# Patient Record
Sex: Female | Born: 1980 | Race: Black or African American | Hispanic: No | Marital: Married | Smoking: Never smoker
Health system: Southern US, Community
[De-identification: ages and names within clinical notes are randomized; demographics above are authoritative.]

## PROBLEM LIST (undated history)

## (undated) DIAGNOSIS — D649 Anemia, unspecified: Secondary | ICD-10-CM

## (undated) DIAGNOSIS — G43909 Migraine, unspecified, not intractable, without status migrainosus: Secondary | ICD-10-CM

## (undated) DIAGNOSIS — G479 Sleep disorder, unspecified: Secondary | ICD-10-CM

## (undated) DIAGNOSIS — G4710 Hypersomnia, unspecified: Secondary | ICD-10-CM

## (undated) DIAGNOSIS — J45909 Unspecified asthma, uncomplicated: Secondary | ICD-10-CM

## (undated) DIAGNOSIS — D489 Neoplasm of uncertain behavior, unspecified: Secondary | ICD-10-CM

## (undated) HISTORY — DX: Neoplasm of uncertain behavior, unspecified: D48.9

## (undated) HISTORY — PX: REFRACTIVE SURGERY: SHX103

## (undated) HISTORY — DX: Hypersomnia, unspecified: G47.10

## (undated) HISTORY — DX: Migraine, unspecified, not intractable, without status migrainosus: G43.909

## (undated) HISTORY — PX: NASAL SEPTUM SURGERY: SHX37

## (undated) HISTORY — PX: TONSILLECTOMY: SUR1361

## (undated) HISTORY — PX: ROTATOR CUFF REPAIR: SHX139

---

## 2013-01-15 DIAGNOSIS — M2550 Pain in unspecified joint: Secondary | ICD-10-CM | POA: Insufficient documentation

## 2013-01-15 DIAGNOSIS — R197 Diarrhea, unspecified: Secondary | ICD-10-CM | POA: Insufficient documentation

## 2013-01-15 DIAGNOSIS — R109 Unspecified abdominal pain: Secondary | ICD-10-CM | POA: Insufficient documentation

## 2013-01-15 DIAGNOSIS — R1013 Epigastric pain: Secondary | ICD-10-CM | POA: Insufficient documentation

## 2013-12-27 DIAGNOSIS — D509 Iron deficiency anemia, unspecified: Secondary | ICD-10-CM | POA: Insufficient documentation

## 2019-01-21 ENCOUNTER — Other Ambulatory Visit: Payer: Self-pay

## 2019-01-21 ENCOUNTER — Emergency Department (HOSPITAL_COMMUNITY)
Admission: EM | Admit: 2019-01-21 | Discharge: 2019-01-21 | Disposition: A | Discharge status: Home / Self Care | Payer: No Typology Code available for payment source | Attending: Emergency Medicine | Admitting: Emergency Medicine

## 2019-01-21 ENCOUNTER — Emergency Department (HOSPITAL_COMMUNITY): Payer: No Typology Code available for payment source

## 2019-01-21 ENCOUNTER — Encounter (HOSPITAL_COMMUNITY): Payer: Self-pay

## 2019-01-21 DIAGNOSIS — M545 Low back pain, unspecified: Secondary | ICD-10-CM

## 2019-01-21 DIAGNOSIS — Y92410 Unspecified street and highway as the place of occurrence of the external cause: Secondary | ICD-10-CM | POA: Diagnosis not present

## 2019-01-21 DIAGNOSIS — M25512 Pain in left shoulder: Secondary | ICD-10-CM

## 2019-01-21 DIAGNOSIS — M25522 Pain in left elbow: Secondary | ICD-10-CM | POA: Insufficient documentation

## 2019-01-21 DIAGNOSIS — Y93I9 Activity, other involving external motion: Secondary | ICD-10-CM | POA: Diagnosis not present

## 2019-01-21 DIAGNOSIS — M79642 Pain in left hand: Secondary | ICD-10-CM | POA: Diagnosis not present

## 2019-01-21 DIAGNOSIS — Y999 Unspecified external cause status: Secondary | ICD-10-CM | POA: Diagnosis not present

## 2019-01-21 MED ORDER — METHOCARBAMOL 500 MG PO TABS: 500.0000 mg | ORAL_TABLET | Freq: Two times a day (BID) | ORAL | 0 refills | Status: DC

## 2019-01-21 MED ORDER — NAPROXEN 500 MG PO TABS: 500.0000 mg | ORAL_TABLET | Freq: Two times a day (BID) | ORAL | 0 refills | Status: DC

## 2019-01-21 NOTE — ED Notes (Signed)
Pt refused to do a urine pregnancy test because she stated that she is on her period, she has the IUD & has not had sex in over a year (per pt) & that she will sign a consent if necessary to skip that part since radiology is waiting on the results of that test to get her scans done. PA was notified by the NT of this zone in the ED.
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
## 2019-01-21 NOTE — ED Notes (Signed)
Patient verbalizes understanding of discharge instructions. Opportunity for questioning and answers were provided. Armband removed by staff, pt discharged from ED.  

## 2019-01-21 NOTE — ED Notes (Signed)
Patient transported to X-ray 
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
## 2019-05-14 ENCOUNTER — Other Ambulatory Visit: Payer: Self-pay

## 2019-05-14 ENCOUNTER — Encounter (HOSPITAL_BASED_OUTPATIENT_CLINIC_OR_DEPARTMENT_OTHER): Payer: Self-pay | Admitting: Orthopedic Surgery

## 2019-05-14 ENCOUNTER — Other Ambulatory Visit (HOSPITAL_COMMUNITY)
Admission: RE | Admit: 2019-05-14 | Discharge: 2019-05-14 | Disposition: A | Discharge status: Home / Self Care | Payer: 59 | Source: Ambulatory Visit | Attending: Orthopedic Surgery | Admitting: Orthopedic Surgery

## 2019-05-14 DIAGNOSIS — Z20822 Contact with and (suspected) exposure to covid-19: Secondary | ICD-10-CM | POA: Insufficient documentation

## 2019-05-14 DIAGNOSIS — Z01812 Encounter for preprocedural laboratory examination: Secondary | ICD-10-CM | POA: Diagnosis present

## 2019-05-14 LAB — SARS CORONAVIRUS 2 (TAT 6-24 HRS): SARS Coronavirus 2: NEGATIVE
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
## 2019-05-16 ENCOUNTER — Encounter (HOSPITAL_BASED_OUTPATIENT_CLINIC_OR_DEPARTMENT_OTHER)
Admission: RE | Disposition: A | Discharge status: Home / Self Care | Payer: Self-pay | Source: Home / Self Care | Attending: Orthopedic Surgery

## 2019-05-16 ENCOUNTER — Ambulatory Visit (HOSPITAL_BASED_OUTPATIENT_CLINIC_OR_DEPARTMENT_OTHER): Payer: 59 | Admitting: Certified Registered"

## 2019-05-16 ENCOUNTER — Other Ambulatory Visit: Payer: Self-pay

## 2019-05-16 ENCOUNTER — Ambulatory Visit (HOSPITAL_BASED_OUTPATIENT_CLINIC_OR_DEPARTMENT_OTHER)
Admission: RE | Admit: 2019-05-16 | Discharge: 2019-05-16 | Disposition: A | Discharge status: Home / Self Care | Payer: 59 | Attending: Orthopedic Surgery | Admitting: Orthopedic Surgery

## 2019-05-16 ENCOUNTER — Encounter (HOSPITAL_BASED_OUTPATIENT_CLINIC_OR_DEPARTMENT_OTHER): Payer: Self-pay | Admitting: Orthopedic Surgery

## 2019-05-16 DIAGNOSIS — M25831 Other specified joint disorders, right wrist: Secondary | ICD-10-CM | POA: Insufficient documentation

## 2019-05-16 DIAGNOSIS — M25531 Pain in right wrist: Secondary | ICD-10-CM | POA: Diagnosis present

## 2019-05-16 DIAGNOSIS — E669 Obesity, unspecified: Secondary | ICD-10-CM | POA: Insufficient documentation

## 2019-05-16 DIAGNOSIS — Z6837 Body mass index (BMI) 37.0-37.9, adult: Secondary | ICD-10-CM | POA: Diagnosis not present

## 2019-05-16 DIAGNOSIS — D481 Neoplasm of uncertain behavior of connective and other soft tissue: Secondary | ICD-10-CM | POA: Insufficient documentation

## 2019-05-16 HISTORY — DX: Unspecified asthma, uncomplicated: J45.909

## 2019-05-16 HISTORY — DX: Sleep disorder, unspecified: G47.9

## 2019-05-16 HISTORY — DX: Anemia, unspecified: D64.9

## 2019-05-16 HISTORY — PX: MASS EXCISION: SHX2000

## 2019-05-16 LAB — POCT PREGNANCY, URINE: Preg Test, Ur: NEGATIVE

## 2019-05-16 SURGERY — EXCISION MASS
Anesthesia: General | Site: Wrist | Laterality: Right

## 2019-05-16 MED ORDER — LIDOCAINE 2% (20 MG/ML) 5 ML SYRINGE
INTRAMUSCULAR | Status: AC
  Filled 2019-05-16: qty 5

## 2019-05-16 MED ORDER — 0.9 % SODIUM CHLORIDE (POUR BTL) OPTIME
TOPICAL | Status: DC | PRN
  Administered 2019-05-16: 300 mL

## 2019-05-16 MED ORDER — PROPOFOL 10 MG/ML IV BOLUS
INTRAVENOUS | Status: AC
  Filled 2019-05-16: qty 20

## 2019-05-16 MED ORDER — ONDANSETRON HCL 4 MG/2ML IJ SOLN
INTRAMUSCULAR | Status: AC
  Filled 2019-05-16: qty 2

## 2019-05-16 MED ORDER — FENTANYL CITRATE (PF) 100 MCG/2ML IJ SOLN
INTRAMUSCULAR | Status: AC
  Filled 2019-05-16: qty 2

## 2019-05-16 MED ORDER — HYDROCODONE-ACETAMINOPHEN 5-325 MG PO TABS
ORAL_TABLET | ORAL | Status: AC
  Filled 2019-05-16: qty 1

## 2019-05-16 MED ORDER — FENTANYL CITRATE (PF) 100 MCG/2ML IJ SOLN: 50.0000 ug | INTRAMUSCULAR | Status: DC | PRN

## 2019-05-16 MED ORDER — OXYCODONE HCL 5 MG PO TABS: 5.0000 mg | ORAL_TABLET | Freq: Once | ORAL | Status: DC | PRN

## 2019-05-16 MED ORDER — BUPIVACAINE HCL (PF) 0.25 % IJ SOLN
INTRAMUSCULAR | Status: AC
  Filled 2019-05-16: qty 30

## 2019-05-16 MED ORDER — KETOROLAC TROMETHAMINE 30 MG/ML IJ SOLN: 30.0000 mg | Freq: Once | INTRAMUSCULAR | Status: DC | PRN

## 2019-05-16 MED ORDER — FENTANYL CITRATE (PF) 100 MCG/2ML IJ SOLN
25.0000 ug | INTRAMUSCULAR | Status: DC | PRN
  Administered 2019-05-16: 25 ug via INTRAVENOUS

## 2019-05-16 MED ORDER — BUPIVACAINE HCL (PF) 0.25 % IJ SOLN
INTRAMUSCULAR | Status: DC | PRN
  Administered 2019-05-16: 9 mL

## 2019-05-16 MED ORDER — HYDROCODONE-ACETAMINOPHEN 5-325 MG PO TABS
1.0000 | ORAL_TABLET | Freq: Once | ORAL | Status: AC
  Administered 2019-05-16: 1 via ORAL

## 2019-05-16 MED ORDER — OXYCODONE HCL 5 MG/5ML PO SOLN: 5.0000 mg | Freq: Once | ORAL | Status: DC | PRN

## 2019-05-16 MED ORDER — MIDAZOLAM HCL 5 MG/5ML IJ SOLN
INTRAMUSCULAR | Status: DC | PRN
  Administered 2019-05-16: 2 mg via INTRAVENOUS

## 2019-05-16 MED ORDER — MIDAZOLAM HCL 2 MG/2ML IJ SOLN
INTRAMUSCULAR | Status: AC
  Filled 2019-05-16: qty 2

## 2019-05-16 MED ORDER — DEXAMETHASONE SODIUM PHOSPHATE 10 MG/ML IJ SOLN
INTRAMUSCULAR | Status: AC
  Filled 2019-05-16: qty 2

## 2019-05-16 MED ORDER — FENTANYL CITRATE (PF) 250 MCG/5ML IJ SOLN
INTRAMUSCULAR | Status: DC | PRN
  Administered 2019-05-16 (×4): 25 ug via INTRAVENOUS

## 2019-05-16 MED ORDER — DEXAMETHASONE SODIUM PHOSPHATE 10 MG/ML IJ SOLN
INTRAMUSCULAR | Status: DC | PRN
  Administered 2019-05-16: 4 mg via INTRAVENOUS

## 2019-05-16 MED ORDER — LIDOCAINE 2% (20 MG/ML) 5 ML SYRINGE
INTRAMUSCULAR | Status: DC | PRN
  Administered 2019-05-16: 100 mg via INTRAVENOUS

## 2019-05-16 MED ORDER — PROMETHAZINE HCL 25 MG/ML IJ SOLN: 6.2500 mg | INTRAMUSCULAR | Status: DC | PRN

## 2019-05-16 MED ORDER — MIDAZOLAM HCL 2 MG/2ML IJ SOLN: 1.0000 mg | INTRAMUSCULAR | Status: DC | PRN

## 2019-05-16 MED ORDER — LACTATED RINGERS IV SOLN: INTRAVENOUS | Status: DC

## 2019-05-16 MED ORDER — ONDANSETRON HCL 4 MG/2ML IJ SOLN
INTRAMUSCULAR | Status: DC | PRN
  Administered 2019-05-16: 4 mg via INTRAVENOUS

## 2019-05-16 MED ORDER — CEFAZOLIN SODIUM-DEXTROSE 2-4 GM/100ML-% IV SOLN
2.0000 g | INTRAVENOUS | Status: AC
  Administered 2019-05-16: 13:00:00 2 g via INTRAVENOUS

## 2019-05-16 MED ORDER — PROPOFOL 10 MG/ML IV BOLUS
INTRAVENOUS | Status: DC | PRN
  Administered 2019-05-16: 200 mg via INTRAVENOUS

## 2019-05-16 MED ORDER — CEFAZOLIN SODIUM-DEXTROSE 2-4 GM/100ML-% IV SOLN
INTRAVENOUS | Status: AC
  Filled 2019-05-16: qty 100

## 2019-05-16 MED ORDER — CHLORHEXIDINE GLUCONATE 4 % EX LIQD: 60.0000 mL | Freq: Once | CUTANEOUS | Status: DC

## 2019-05-16 SURGICAL SUPPLY — 48 items
BENZOIN TINCTURE PRP APPL 2/3 (GAUZE/BANDAGES/DRESSINGS) IMPLANT
BLADE SURG 15 STRL LF DISP TIS (BLADE) ×1 IMPLANT
BLADE SURG 15 STRL SS (BLADE) ×1
BNDG CONFORM 3 STRL LF (GAUZE/BANDAGES/DRESSINGS) ×2 IMPLANT
BNDG ELASTIC 3X5.8 VLCR STR LF (GAUZE/BANDAGES/DRESSINGS) ×2 IMPLANT
BNDG ELASTIC 4X5.8 VLCR STR LF (GAUZE/BANDAGES/DRESSINGS) IMPLANT
BNDG ESMARK 4X9 LF (GAUZE/BANDAGES/DRESSINGS) ×2 IMPLANT
BNDG GAUZE 1X2.1 STRL (MISCELLANEOUS) IMPLANT
CORD BIPOLAR FORCEPS 12FT (ELECTRODE) ×2 IMPLANT
COVER BACK TABLE 60X90IN (DRAPES) ×2 IMPLANT
COVER MAYO STAND STRL (DRAPES) ×2 IMPLANT
COVER WAND RF STERILE (DRAPES) IMPLANT
CUFF TOURN SGL QUICK 18X4 (TOURNIQUET CUFF) ×2 IMPLANT
DECANTER SPIKE VIAL GLASS SM (MISCELLANEOUS) IMPLANT
DRAPE EXTREMITY T 121X128X90 (DISPOSABLE) ×2 IMPLANT
DRAPE SURG 17X23 STRL (DRAPES) ×2 IMPLANT
DRSG EMULSION OIL 3X3 NADH (GAUZE/BANDAGES/DRESSINGS) ×2 IMPLANT
GAUZE SPONGE 4X4 12PLY STRL (GAUZE/BANDAGES/DRESSINGS) ×2 IMPLANT
GLOVE BIOGEL PI IND STRL 8.5 (GLOVE) ×1 IMPLANT
GLOVE BIOGEL PI INDICATOR 8.5 (GLOVE) ×1
GLOVE SURG ORTHO 8.0 STRL STRW (GLOVE) ×2 IMPLANT
GLOVE SURG SS PI 7.0 STRL IVOR (GLOVE) ×2 IMPLANT
GOWN STRL REUS W/ TWL LRG LVL3 (GOWN DISPOSABLE) ×2 IMPLANT
GOWN STRL REUS W/ TWL XL LVL3 (GOWN DISPOSABLE) ×1 IMPLANT
GOWN STRL REUS W/TWL LRG LVL3 (GOWN DISPOSABLE) ×2
GOWN STRL REUS W/TWL XL LVL3 (GOWN DISPOSABLE) ×1
NEEDLE HYPO 25X1 1.5 SAFETY (NEEDLE) ×2 IMPLANT
NS IRRIG 1000ML POUR BTL (IV SOLUTION) ×2 IMPLANT
PACK BASIN DAY SURGERY FS (CUSTOM PROCEDURE TRAY) ×2 IMPLANT
PAD CAST 3X4 CTTN HI CHSV (CAST SUPPLIES) ×1 IMPLANT
PADDING CAST ABS 3INX4YD NS (CAST SUPPLIES) ×1
PADDING CAST ABS 4INX4YD NS (CAST SUPPLIES)
PADDING CAST ABS COTTON 3X4 (CAST SUPPLIES) ×1 IMPLANT
PADDING CAST ABS COTTON 4X4 ST (CAST SUPPLIES) IMPLANT
PADDING CAST COTTON 3X4 STRL (CAST SUPPLIES) ×1
SPLINT FIBERGLASS 3X35 (CAST SUPPLIES) ×2 IMPLANT
SPLINT PLASTER CAST XFAST 3X15 (CAST SUPPLIES) IMPLANT
SPLINT PLASTER XTRA FASTSET 3X (CAST SUPPLIES)
STOCKINETTE 4X48 STRL (DRAPES) ×2 IMPLANT
STRIP CLOSURE SKIN 1/2X4 (GAUZE/BANDAGES/DRESSINGS) IMPLANT
SUT MNCRL AB 3-0 PS2 18 (SUTURE) ×2 IMPLANT
SUT MNCRL AB 4-0 PS2 18 (SUTURE) ×2 IMPLANT
SUT PROLENE 4 0 PS 2 18 (SUTURE) ×2 IMPLANT
SYR BULB 3OZ (MISCELLANEOUS) ×2 IMPLANT
SYR CONTROL 10ML LL (SYRINGE) ×2 IMPLANT
TOWEL GREEN STERILE FF (TOWEL DISPOSABLE) ×2 IMPLANT
TRAY DSU PREP LF (CUSTOM PROCEDURE TRAY) ×2 IMPLANT
UNDERPAD 30X36 HEAVY ABSORB (UNDERPADS AND DIAPERS) ×2 IMPLANT

## 2019-05-16 NOTE — Anesthesia Preprocedure Evaluation (Signed)
Anesthesia Evaluation  Patient identified by MRN, date of birth, ID band Patient awake    Reviewed: Allergy & Precautions, NPO status , Patient's Chart, lab work & pertinent test results  Airway Mallampati: II  TM Distance: >3 FB Neck ROM: Full    Dental no notable dental hx.    Pulmonary neg pulmonary ROS,    Pulmonary exam normal breath sounds clear to auscultation       Cardiovascular negative cardio ROS Normal cardiovascular exam Rhythm:Regular Rate:Normal     Neuro/Psych negative neurological ROS  negative psych ROS   GI/Hepatic negative GI ROS, Neg liver ROS,   Endo/Other  obesity  Renal/GU negative Renal ROS  negative genitourinary   Musculoskeletal negative musculoskeletal ROS (+)   Abdominal   Peds negative pediatric ROS (+)  Hematology negative hematology ROS (+)   Anesthesia Other Findings   Reproductive/Obstetrics negative OB ROS                             Anesthesia Physical Anesthesia Plan  ASA: II  Anesthesia Plan: General   Post-op Pain Management:    Induction: Intravenous  PONV Risk Score and Plan: 3 and Ondansetron, Dexamethasone and Treatment may vary due to age or medical condition  Airway Management Planned: LMA  Additional Equipment:   Intra-op Plan:   Post-operative Plan: Extubation in OR  Informed Consent: I have reviewed the patients History and Physical, chart, labs and discussed the procedure including the risks, benefits and alternatives for the proposed anesthesia with the patient or authorized representative who has indicated his/her understanding and acceptance.     Dental advisory given  Plan Discussed with: CRNA and Surgeon  Anesthesia Plan Comments:         Anesthesia Quick Evaluation

## 2019-05-16 NOTE — Anesthesia Procedure Notes (Signed)
Procedure Name: LMA Insertion Date/Time: 05/16/2019 1:22 PM Performed by: Myna Bright, CRNA Pre-anesthesia Checklist: Patient identified, Emergency Drugs available, Suction available and Patient being monitored Patient Re-evaluated:Patient Re-evaluated prior to induction Oxygen Delivery Method: Circle system utilized Preoxygenation: Pre-oxygenation with 100% oxygen Induction Type: IV induction Ventilation: Mask ventilation without difficulty LMA: LMA inserted LMA Size: 4.0 Number of attempts: 1 Placement Confirmation: positive ETCO2 and breath sounds checked- equal and bilateral Tube secured with: Tape Dental Injury: Teeth and Oropharynx as per pre-operative assessment

## 2019-05-16 NOTE — Transfer of Care (Signed)
Immediate Anesthesia Transfer of Care Note  Patient: Kaylee Cowan  Procedure(s) Performed: Excision right wrist volar mass (Right Wrist)  Patient Location: PACU  Anesthesia Type:General  Level of Consciousness: awake, alert , oriented and patient cooperative  Airway & Oxygen Therapy: Patient Spontanous Breathing and Patient connected to face mask oxygen  Post-op Assessment: Report given to RN, Post -op Vital signs reviewed and stable and Patient moving all extremities  Post vital signs: Reviewed and stable  Last Vitals:  Vitals Value Taken Time  BP 124/83 05/16/19 1400  Temp    Pulse 109 05/16/19 1403  Resp 19 05/16/19 1403  SpO2 100 % 05/16/19 1403  Vitals shown include unvalidated device data.  Last Pain:  Vitals:   05/16/19 1055  TempSrc: Oral  PainSc: 0-No pain      Patients Stated Pain Goal: 1 ([id number])  Complications: No apparent anesthesia complications

## 2019-05-16 NOTE — Discharge Instructions (Signed)
KEEP [facility] AND DRY CALL OFFICE FOR F/U APPT [phone] in 13 days Rx sent to [facility] on [address] TO APPLY ICE TO OPERATIVE AREA CONTACT OFFICE IF ANY WORSENING PAIN OR CONCERNS.   Discharge Instructions After Orthopedic Procedures:  *You may feel tired and weak following your procedure. It is recommended that you limit physical activity for the next 24 hours and rest at home for the remainder of today and tomorrow. *No strenuous activity should be started without your doctor's permission.  Elevate the extremity that you had surgery on to a level above your heart. This should continue for 48 hours or as instructed by your doctor.  If you had hand, arm or shoulder surgery you should move your fingers frequently unless otherwise instructed by your doctor.  If you had foot, knee or leg surgery you should wiggle your toes frequently unless otherwise instructed by your doctor.  Follow your doctor's exact instructions for activity at home. Use your home equipment as instructed. (Crutches, hard shoes, slings etc.)  Limit your activity as instructed by your doctor.  Report to your doctor should any of the following occur: 1. Extreme swelling of your fingers or toes. 2. Inability to wiggle your fingers or toes. 3. Coldness, pale or bluish color in your fingers or toes. 4. Loss of sensation, numbness or tingling of your fingers or toes. 5. Unusual smell or odor from under your dressing or cast. 6. Excessive bleeding or drainage from the surgical site. 7. Pain not relieved by medication your doctor has prescribed for you. 8. Cast or dressing too tight (do not get your dressing or cast wet or put anything under          your dressing or cast.)  *Do not change your dressing unless instructed by your doctor or discharge nurse. Then follow exact instructions.  *Follow labeled instructions for any medications that your doctor may have prescribed for  you. *Should any questions or complications develop following your procedure, PLEASE CONTACT YOUR DOCTOR.   Call your surgeon if you experience:   1.  Fever over 101.0. 2.  Inability to urinate. 3.  Nausea and/or vomiting. 4.  Extreme swelling or bruising at the surgical site. 5.  Continued bleeding from the incision. 6.  Increased pain, redness or drainage from the incision. 7.  Problems related to your pain medication. 8.  Any problems and/or concerns    Post Anesthesia Home Care Instructions  Activity: Get plenty of rest for the remainder of the day. A responsible individual must stay with you for 24 hours following the procedure.  For the next 24 hours, DO NOT: -Drive a car -Paediatric nurse -Drink alcoholic beverages -Take any medication unless instructed by your physician -Make any legal decisions or sign important papers.  Meals: Start with liquid foods such as gelatin or soup. Progress to regular foods as tolerated. Avoid greasy, spicy, heavy foods. If nausea and/or vomiting occur, drink only clear liquids until the nausea and/or vomiting subsides. Call your physician if vomiting continues.  Special Instructions/Symptoms: Your throat may feel dry or sore from the anesthesia or the breathing tube placed in your throat during surgery. If this causes discomfort, gargle with warm salt water. The discomfort should disappear within 24 hours.  If you had a scopolamine patch placed behind your ear for the management of post- operative nausea and/or vomiting:  1. The medication in the patch is effective for 72 hours, after which it should be  removed.  Wrap patch in a tissue and discard in the trash. Wash hands thoroughly with soap and water. 2. You may remove the patch earlier than 72 hours if you experience unpleasant side effects which may include dry mouth, dizziness or visual disturbances. 3. Avoid touching the patch. Wash your hands with soap and water after contact with  the patch.

## 2019-05-16 NOTE — Op Note (Signed)
PREOPERATIVE DIAGNOSIS: right wrist mass, volar deep mass   POSTOPERATIVE DIAGNOSIS:Same  ATTENDING SURGEON:dr. Iran Planas   ASSISTANT SURGEON: Gertie Fey, [facility] who scrubbed in necessary for exposure mass removal closure and splinting in a timely fashion.  ANESTHESIA: General via laryngeal mask airway  OPERATIVE PROCEDURE: Excision tumor soft tissue right wrist subfascial greater than 3 cm.  IMPLANTS: None  RADIOGRAPHIC INTERPRETATION: None  SURGICAL INDICATIONS: Patient is a right-hand-dominant female with enlarging mass directly to the volar aspect of the wrist.  Patient elected undergo the above procedure.  Risk benefits alternatives discussed in detail with the patient and signed informed consent was obtained.  Risks include but not limited to bleeding infection damage nearby nerves arteries or tendons loss of motion of the wrist and digits incomplete relief of symptoms need for further surgical invention.  SURGICAL TECHNIQUE: Patient was palpated find the preoperative holding area marked for marker made on the right wrist indicate correct operative site.  Patient brought back operating placed supine on anesthesia table where the general anesthetic was administered.  Preoperative antibiotics were given for any skin incision.  A well-padded tourniquet was then placed on the right brachium seal with the appropriate drape.  The right upper extremities then prepped and draped in normal sterile fashion.  A timeout was called the correct site was then fine procedure then begun.  Attention was then turned to the right wrist.  Longitudinal incision curving just ulnarly at the level of the wrist crease was then made directly over the FCR sheath.  Dissection carried down through the skin and subcutaneous tissue.  The FCR sheath was then opened and going through the floor the FCR sheath the deep fascia was then opened up exposing the well encapsulated mass.  This was a greater than 3 cm mass.   Circumferential dissection carried all the way down to the distal radial ulnar joint where the mass was then removed.  This was removed with the ends in its entirety.  No other abnormalities were noted.  The wound was then thoroughly irrigated.  Subcutaneous tissues were then closed with Monocryl.  The skin was then closed with simple Prolene suture.  Adaptic dressing sterile compressive bandage then applied.  The patient was then placed in a well-padded volar splint taken recovery in good condition.  POSTOPERATIVE PLAN: Patient be discharged to home.  See him back in the office in 10 to 14 days for wound check suture removal go over the pathology.  Transition to a short arm brace and then gradual use and activity.  Pathology: Volar mass to pathology.

## 2019-05-16 NOTE — Anesthesia Postprocedure Evaluation (Signed)
Anesthesia Post Note  Patient: Kaylee Cowan  Procedure(s) Performed: Excision right wrist volar mass (Right Wrist)     Patient location during evaluation: PACU Anesthesia Type: General Level of consciousness: awake and alert Pain management: pain level controlled Vital Signs Assessment: post-procedure vital signs reviewed and stable Respiratory status: spontaneous breathing, nonlabored ventilation, respiratory function stable and patient connected to nasal cannula oxygen Cardiovascular status: blood pressure returned to baseline and stable Postop Assessment: no apparent nausea or vomiting Anesthetic complications: no    Last Vitals:  Vitals:   05/16/19 1500 05/16/19 1512  BP: 103/69 112/71  Pulse: 86 88  Resp: 20   Temp:    SpO2: 100% 100%    Last Pain:  Vitals:   05/16/19 1512  TempSrc:   PainSc: 3                  Canary Fister S

## 2019-05-17 LAB — SURGICAL PATHOLOGY

## 2019-08-16 ENCOUNTER — Other Ambulatory Visit: Payer: Self-pay

## 2019-08-17 ENCOUNTER — Ambulatory Visit (INDEPENDENT_AMBULATORY_CARE_PROVIDER_SITE_OTHER): Payer: 59 | Admitting: Family Medicine

## 2019-08-17 ENCOUNTER — Encounter: Payer: Self-pay | Admitting: Family Medicine

## 2019-08-17 VITALS — BP 122/82 | HR 103 | Temp 97.4°F | Ht 65.0 in | Wt 231.7 lb

## 2019-08-17 DIAGNOSIS — R600 Localized edema: Secondary | ICD-10-CM | POA: Diagnosis not present

## 2019-08-17 DIAGNOSIS — G4711 Idiopathic hypersomnia with long sleep time: Secondary | ICD-10-CM

## 2019-08-17 DIAGNOSIS — I739 Peripheral vascular disease, unspecified: Secondary | ICD-10-CM

## 2019-08-17 DIAGNOSIS — G43909 Migraine, unspecified, not intractable, without status migrainosus: Secondary | ICD-10-CM | POA: Diagnosis not present
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
## 2019-08-21 DIAGNOSIS — R600 Localized edema: Secondary | ICD-10-CM | POA: Insufficient documentation

## 2019-08-21 DIAGNOSIS — G4711 Idiopathic hypersomnia with long sleep time: Secondary | ICD-10-CM | POA: Insufficient documentation

## 2019-08-21 DIAGNOSIS — G43909 Migraine, unspecified, not intractable, without status migrainosus: Secondary | ICD-10-CM | POA: Insufficient documentation

## 2019-08-21 DIAGNOSIS — I739 Peripheral vascular disease, unspecified: Secondary | ICD-10-CM | POA: Insufficient documentation

## 2019-08-23 ENCOUNTER — Encounter: Payer: Self-pay | Admitting: Family Medicine

## 2019-09-10 ENCOUNTER — Other Ambulatory Visit: Payer: Self-pay

## 2019-09-10 ENCOUNTER — Encounter: Payer: Self-pay | Admitting: Neurology

## 2019-09-10 ENCOUNTER — Encounter: Payer: Self-pay | Admitting: Family Medicine

## 2019-09-10 ENCOUNTER — Ambulatory Visit (INDEPENDENT_AMBULATORY_CARE_PROVIDER_SITE_OTHER): Payer: 59 | Admitting: Neurology

## 2019-09-10 VITALS — BP 121/84 | HR 85 | Ht 65.0 in | Wt 228.0 lb

## 2019-09-10 DIAGNOSIS — G4710 Hypersomnia, unspecified: Secondary | ICD-10-CM

## 2019-09-10 DIAGNOSIS — E6609 Other obesity due to excess calories: Secondary | ICD-10-CM

## 2019-09-10 DIAGNOSIS — Z6837 Body mass index (BMI) 37.0-37.9, adult: Secondary | ICD-10-CM

## 2019-09-10 DIAGNOSIS — G4719 Other hypersomnia: Secondary | ICD-10-CM

## 2019-09-10 DIAGNOSIS — G4730 Sleep apnea, unspecified: Secondary | ICD-10-CM

## 2019-09-10 DIAGNOSIS — Z131 Encounter for screening for diabetes mellitus: Secondary | ICD-10-CM

## 2019-09-10 DIAGNOSIS — G43711 Chronic migraine without aura, intractable, with status migrainosus: Secondary | ICD-10-CM

## 2019-09-10 DIAGNOSIS — Z30433 Encounter for removal and reinsertion of intrauterine contraceptive device: Secondary | ICD-10-CM

## 2019-09-10 DIAGNOSIS — Z13220 Encounter for screening for lipoid disorders: Secondary | ICD-10-CM

## 2019-09-10 DIAGNOSIS — Z77018 Contact with and (suspected) exposure to other hazardous metals: Secondary | ICD-10-CM

## 2019-09-10 DIAGNOSIS — R5383 Other fatigue: Secondary | ICD-10-CM

## 2019-09-10 DIAGNOSIS — E66812 Obesity, class 2: Secondary | ICD-10-CM

## 2019-09-10 DIAGNOSIS — G4711 Idiopathic hypersomnia with long sleep time: Secondary | ICD-10-CM

## 2019-09-10 NOTE — Patient Instructions (Signed)
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
## 2019-09-12 ENCOUNTER — Telehealth: Payer: Self-pay

## 2019-09-12 DIAGNOSIS — R200 Anesthesia of skin: Secondary | ICD-10-CM | POA: Insufficient documentation

## 2019-09-12 DIAGNOSIS — R600 Localized edema: Secondary | ICD-10-CM | POA: Insufficient documentation

## 2019-09-12 DIAGNOSIS — G4711 Idiopathic hypersomnia with long sleep time: Secondary | ICD-10-CM

## 2019-09-12 NOTE — Telephone Encounter (Signed)
Order placed for the HST 

## 2019-09-12 NOTE — Addendum Note (Signed)
Addended by: Darleen Crocker on: 09/12/2019 01:33 PM   Modules accepted: Orders

## 2019-09-12 NOTE — Telephone Encounter (Signed)
Insurance has denied NPSG & MSLT request. Would you like to order a HST to rule out apnea first? If so, please send me an order over for this so that we can get approval from insurance. Thanks

## 2019-09-14 ENCOUNTER — Encounter (HOSPITAL_COMMUNITY): Payer: Self-pay

## 2019-09-14 ENCOUNTER — Emergency Department (HOSPITAL_COMMUNITY): Payer: 59

## 2019-09-14 ENCOUNTER — Emergency Department (HOSPITAL_COMMUNITY)
Admission: EM | Admit: 2019-09-14 | Discharge: 2019-09-15 | Disposition: A | Discharge status: Home / Self Care | Payer: 59 | Attending: Emergency Medicine | Admitting: Emergency Medicine

## 2019-09-14 ENCOUNTER — Other Ambulatory Visit: Payer: Self-pay

## 2019-09-14 DIAGNOSIS — G43109 Migraine with aura, not intractable, without status migrainosus: Secondary | ICD-10-CM | POA: Diagnosis not present

## 2019-09-14 DIAGNOSIS — R202 Paresthesia of skin: Secondary | ICD-10-CM | POA: Diagnosis present

## 2019-09-14 DIAGNOSIS — R791 Abnormal coagulation profile: Secondary | ICD-10-CM | POA: Diagnosis not present

## 2019-09-14 DIAGNOSIS — R200 Anesthesia of skin: Secondary | ICD-10-CM

## 2019-09-14 DIAGNOSIS — J45909 Unspecified asthma, uncomplicated: Secondary | ICD-10-CM | POA: Insufficient documentation

## 2019-09-14 LAB — I-STAT CHEM 8, ED
BUN: 12 mg/dL (ref 6–20)
Calcium, Ion: 1.16 mmol/L (ref 1.15–1.40)
Chloride: 104 mmol/L (ref 98–111)
Creatinine, Ser: 0.7 mg/dL (ref 0.44–1.00)
Glucose, Bld: 98 mg/dL (ref 70–99)
HCT: 37 % (ref 36.0–46.0)
Hemoglobin: 12.6 g/dL (ref 12.0–15.0)
Potassium: 3.2 mmol/L — ABNORMAL LOW (ref 3.5–5.1)
Sodium: 142 mmol/L (ref 135–145)
TCO2: 23 mmol/L (ref 22–32)

## 2019-09-14 LAB — COMPREHENSIVE METABOLIC PANEL
ALT: 21 U/L (ref 0–44)
AST: 25 U/L (ref 15–41)
Albumin: 3.8 g/dL (ref 3.5–5.0)
Alkaline Phosphatase: 76 U/L (ref 38–126)
Anion gap: 10 (ref 5–15)
BUN: 11 mg/dL (ref 6–20)
CO2: 23 mmol/L (ref 22–32)
Calcium: 8.9 mg/dL (ref 8.9–10.3)
Chloride: 106 mmol/L (ref 98–111)
Creatinine, Ser: 0.82 mg/dL (ref 0.44–1.00)
GFR calc Af Amer: >60 mL/min (ref >60)
GFR calc non Af Amer: >60 mL/min (ref >60)
Glucose, Bld: 98 mg/dL (ref 70–99)
Potassium: 3.2 mmol/L — ABNORMAL LOW (ref 3.5–5.1)
Sodium: 139 mmol/L (ref 135–145)
Total Bilirubin: 0.5 mg/dL (ref 0.3–1.2)
Total Protein: 7.4 g/dL (ref 6.5–8.1)

## 2019-09-14 LAB — CBG MONITORING, ED: Glucose-Capillary: 100 mg/dL — ABNORMAL HIGH (ref 70–99)

## 2019-09-14 LAB — DIFFERENTIAL
Abs Immature Granulocytes: 0.02 10*3/uL (ref 0.00–0.07)
Basophils Absolute: 0.1 10*3/uL (ref 0.0–0.1)
Basophils Relative: 1 %
Eosinophils Absolute: 0.2 10*3/uL (ref 0.0–0.5)
Eosinophils Relative: 2 %
Immature Granulocytes: 0 %
Lymphocytes Relative: 41 %
Lymphs Abs: 4 10*3/uL (ref 0.7–4.0)
Monocytes Absolute: 0.7 10*3/uL (ref 0.1–1.0)
Monocytes Relative: 7 %
Neutro Abs: 4.9 10*3/uL (ref 1.7–7.7)
Neutrophils Relative %: 49 %

## 2019-09-14 LAB — CBC
HCT: 37.1 % (ref 36.0–46.0)
Hemoglobin: 11.8 g/dL — ABNORMAL LOW (ref 12.0–15.0)
MCH: 27.2 pg (ref 26.0–34.0)
MCHC: 31.8 g/dL (ref 30.0–36.0)
MCV: 85.5 fL (ref 80.0–100.0)
Platelets: 418 10*3/uL — ABNORMAL HIGH (ref 150–400)
RBC: 4.34 MIL/uL (ref 3.87–5.11)
RDW: 14.1 % (ref 11.5–15.5)
WBC: 9.8 10*3/uL (ref 4.0–10.5)
nRBC: 0 % (ref 0.0–0.2)

## 2019-09-14 LAB — I-STAT BETA HCG BLOOD, ED (MC, WL, AP ONLY): I-stat hCG, quantitative: <5 m[IU]/mL (ref <5)

## 2019-09-14 LAB — PROTIME-INR
INR: 1 (ref 0.8–1.2)
Prothrombin Time: 12.5 seconds (ref 11.4–15.2)

## 2019-09-14 LAB — APTT: aPTT: 36 seconds (ref 24–36)

## 2019-09-14 MED ORDER — SODIUM CHLORIDE 0.9 % IV BOLUS
1000.0000 mL | Freq: Once | INTRAVENOUS | Status: AC
  Administered 2019-09-14: 1000 mL via INTRAVENOUS

## 2019-09-14 MED ORDER — DIPHENHYDRAMINE HCL 50 MG/ML IJ SOLN
25.0000 mg | Freq: Once | INTRAMUSCULAR | Status: AC
  Administered 2019-09-14: 25 mg via INTRAVENOUS
  Filled 2019-09-14: qty 1

## 2019-09-14 MED ORDER — SODIUM CHLORIDE 0.9% FLUSH
3.0000 mL | Freq: Once | INTRAVENOUS | Status: DC
Start: 2019-09-14 — End: 2019-09-15

## 2019-09-14 MED ORDER — PROCHLORPERAZINE EDISYLATE 10 MG/2ML IJ SOLN
10.0000 mg | Freq: Once | INTRAMUSCULAR | Status: AC
  Administered 2019-09-14: 10 mg via INTRAVENOUS
  Filled 2019-09-14: qty 2

## 2019-09-14 MED ORDER — GADOBUTROL 1 MMOL/ML IV SOLN
10.0000 mL | Freq: Once | INTRAVENOUS | Status: AC | PRN
  Administered 2019-09-14: 10 mL via INTRAVENOUS

## 2019-09-14 NOTE — ED Notes (Signed)
Pt taken to imaging

## 2019-09-14 NOTE — ED Notes (Signed)
Dr. Sedonia Small to triage to eval pt for code stroke.

## 2019-09-14 NOTE — ED Triage Notes (Signed)
Pt arrives to ED w/ c/o L sided tingling sensation in face, arms, leg. LKW 2100. Pt also c/o chest pressure x several months. Pt AOx4, no unilateral weakness, no facial droop, no vision changes.

## 2019-09-14 NOTE — ED Provider Notes (Signed)
11:15 PM  Assumed care.  Pt with HAs and at 9pm started having L sided sensory changes.  MRI pending.  Getting migraine cocktail.  12:15 AM  Pt's MRI shows no acute abnormality.  She has few small nonspecific foci of T2 hyperintensity likely secondary to migraine headaches.  Very low suspicion for MS.  After migraine cocktail her headache and her left-sided sensory deficits have now resolved.  I feel she is safe to be discharged.  She has an outpatient neurologist for follow-up.  She is also comfortable with this plan.  At this time, I do not feel there is any life-threatening condition present. I have reviewed, interpreted and discussed all results (EKG, imaging, lab, urine as appropriate) and exam findings with patient/family. I have reviewed nursing notes and appropriate previous records.  I feel the patient is safe to be discharged home without further emergent workup and can continue workup as an outpatient as needed. Discussed usual and customary return precautions. Patient/family verbalize understanding and are comfortable with this plan.  Outpatient follow-up has been provided as needed. All questions have been answered.     Gillis Boardley, Delice Bison, DO 09/15/19 [phone]

## 2019-09-14 NOTE — ED Provider Notes (Signed)
[facility] Emergency Department Provider Note MRN:  [id number]  Arrival date & time: 09/14/19     Chief Complaint   Numbness History of Present Illness   Kaylee Cowan is a 39 y.o. year-old female with a history of migraines, sleep disorder, hypersomnolence presenting to the ED with chief complaint of numbness.  Patient was driving at approximately 9 PM she experienced a strange feeling in her face, described as a numbness.  Since that time she is continue to experience tingling and/or numbness to the left side of her body including the left face, left arm, left leg.  Denies weakness.  Compares it to receiving numbing medicine at the dentist, more specifically to the face.  The arm and leg does feel "weird".  Symptoms constant since onset.  Has been having headaches over the past week including today.  Has a history of migraines but has never had these numbness or tingling sensations with headaches before.  Review of Systems  A complete 10 system review of systems was obtained and all systems are negative except as noted in the HPI and PMH.   Patient's Health History    Past Medical History:  Diagnosis Date  . Anemia   . Asthma   . Giant cell tumor   . Hypersomnolence   . Migraines   . Sleep disorder     Past Surgical History:  Procedure Laterality Date  . MASS EXCISION Right 05/16/2019   Procedure: Excision right wrist volar mass;  Surgeon: Iran Planas, MD;  Location: [place];  Service: Orthopedics;  Laterality: Right;  with IV sedation  . NASAL SEPTUM SURGERY    . REFRACTIVE SURGERY    . ROTATOR CUFF REPAIR    . TONSILLECTOMY      Family History  Problem Relation Age of Onset  . Diabetes Mother   . Hypertension Father   . Hyperlipidemia Father   . Early death Maternal Grandmother   . Early death Maternal Grandfather   . Heart attack Paternal Grandmother     Social History   Socioeconomic History  . Marital  status: Single    Spouse name: Not on file  . Number of children: Not on file  . Years of education: Not on file  . Highest education level: Not on file  Occupational History  . Not on file  Tobacco Use  . Smoking status: Never Smoker  . Smokeless tobacco: Never Used  Vaping Use  . Vaping Use: Never used  Substance and Sexual Activity  . Alcohol use: Not Currently  . Drug use: Never  . Sexual activity: Not Currently    Birth control/protection: I.U.D.  Other Topics Concern  . Not on file  Social History Narrative  . Not on file   Social Determinants of Health   Financial Resource Strain:   . Difficulty of Paying Living Expenses:   Food Insecurity:   . Worried About Charity fundraiser in the Last Year:   . Arboriculturist in the Last Year:   Transportation Needs:   . Film/video editor (Medical):   Marland Kitchen Lack of Transportation (Non-Medical):   Physical Activity:   . Days of Exercise per Week:   . Minutes of Exercise per Session:   Stress:   . Feeling of Stress :   Social Connections:   . Frequency of Communication with Friends and Family:   . Frequency of Social Gatherings with Friends and Family:   . Attends  Religious Services:   . Active Member of Clubs or Organizations:   . Attends Archivist Meetings:   Marland Kitchen Marital Status:   Intimate Partner Violence:   . Fear of Current or Ex-Partner:   . Emotionally Abused:   Marland Kitchen Physically Abused:   . Sexually Abused:      Physical Exam   Vitals:   09/14/19 2145 09/14/19 2334  BP: (!) 143/109 138/90  Pulse: (!) 109 86  Resp: 18 (!) 23  Temp: 98.1 F (36.7 C) 98.6 F (37 C)  SpO2: 100% 100%    CONSTITUTIONAL: Well-appearing, NAD NEURO:  Alert and oriented x 3, subjective decreased sensation to the left cheek, otherwise normal and symmetric strength, normal coordination, normal speech, no visual field cuts, no neglect, normal ambulation EYES:  eyes equal and reactive ENT/NECK:  no LAD, no JVD CARDIO:  Regular rate, well-perfused, normal S1 and S2 PULM:  CTAB no wheezing or rhonchi GI/GU:  normal bowel sounds, non-distended, non-tender MSK/SPINE:  No gross deformities, no edema SKIN:  no rash, atraumatic PSYCH:  Appropriate speech and behavior  *Additional and/or pertinent findings included in MDM below  Diagnostic and Interventional Summary    EKG Interpretation  Date/Time:  Friday September 14 2019 21:33:43 EDT Ventricular Rate:  111 PR Interval:  132 QRS Duration: 78 QT Interval:  338 QTC Calculation: 459 R Axis:   28 Text Interpretation: Sinus tachycardia Otherwise normal ECG No old tracing to compare Confirmed by Ward, Cyril Mourning [id number]) on 09/14/2019 11:12:53 PM      Labs Reviewed  CBC - Abnormal; Notable for the following components:      Result Value   Hemoglobin 11.8 (*)    Platelets 418 (*)    All other components within normal limits  COMPREHENSIVE METABOLIC PANEL - Abnormal; Notable for the following components:   Potassium 3.2 (*)    All other components within normal limits  I-STAT CHEM 8, ED - Abnormal; Notable for the following components:   Potassium 3.2 (*)    All other components within normal limits  CBG MONITORING, ED - Abnormal; Notable for the following components:   Glucose-Capillary 100 (*)    All other components within normal limits  PROTIME-INR  APTT  DIFFERENTIAL  I-STAT BETA HCG BLOOD, ED ([facility], [facility], [facility] ONLY)    MR Brain W and Wo Contrast  Final Result      Medications  sodium chloride flush (NS) 0.9 % injection 3 mL (has no administration in time range)  diphenhydrAMINE (BENADRYL) injection 25 mg (25 mg Intravenous Given 09/14/19 2350)  prochlorperazine (COMPAZINE) injection 10 mg (10 mg Intravenous Given 09/14/19 2349)  sodium chloride 0.9 % bolus 1,000 mL (1,000 mLs Intravenous New Bag/Given 09/14/19 2352)  gadobutrol (GADAVIST) 1 MMOL/ML injection 10 mL (10 mLs Intravenous Contrast Given 09/14/19 2314)     Procedures  /  Critical  Care Procedures  ED Course and Medical Decision Making  I have reviewed the triage vital signs, the nursing notes, and pertinent available records from the EMR.  Listed above are laboratory and imaging tests that I personally ordered, reviewed, and interpreted and then considered in my medical decision making (see below for details).      Favoring complex migraine given patient's history of migraines, her young age and lack of cardiovascular risk factors make this unlikely to be an acute ischemic stroke.  Her NIH stroke scale is currently 1.  I discussed this case with Dr. Rory Percy of neurology who agrees that patient would  not be offered TPA given the minimal nature of her exam, plan is to obtain MRI with and without and provide migraine cocktail and reassess.  Signed out to oncoming provider at shift change.    Barth Kirks. Sedonia Small, [place] [email or web address]  Final Clinical Impressions(s) / ED Diagnoses     ICD-10-CM   1. Facial numbness  R20.0     ED Discharge Orders    None       Discharge Instructions Discussed with and Provided to Patient:   Discharge Instructions   None       Maudie Flakes, MD 09/14/19 2358

## 2019-09-15 ENCOUNTER — Encounter: Payer: Self-pay | Admitting: Neurology

## 2019-09-15 NOTE — Discharge Instructions (Addendum)
Please follow-up with your outpatient neurologist.

## 2019-09-18 LAB — NARCOLEPSY EVALUATION
DQA1*01:02: POSITIVE
DQB1*06:02: NEGATIVE

## 2019-09-18 NOTE — Progress Notes (Signed)
One allel positive for narcolepsy trait.

## 2019-09-18 NOTE — Progress Notes (Signed)
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

## 2019-09-20 ENCOUNTER — Encounter: Payer: Self-pay | Admitting: Neurology

## 2019-09-24 ENCOUNTER — Other Ambulatory Visit: Payer: 59

## 2019-09-25 ENCOUNTER — Ambulatory Visit (INDEPENDENT_AMBULATORY_CARE_PROVIDER_SITE_OTHER): Payer: 59 | Admitting: Orthopaedic Surgery

## 2019-09-25 ENCOUNTER — Other Ambulatory Visit: Payer: 59

## 2019-09-25 ENCOUNTER — Other Ambulatory Visit: Payer: Self-pay

## 2019-09-25 ENCOUNTER — Ambulatory Visit (INDEPENDENT_AMBULATORY_CARE_PROVIDER_SITE_OTHER): Payer: 59

## 2019-09-25 VITALS — BP 119/81 | HR 91 | Ht 65.0 in | Wt 230.0 lb

## 2019-09-25 DIAGNOSIS — R5383 Other fatigue: Secondary | ICD-10-CM

## 2019-09-25 DIAGNOSIS — G8929 Other chronic pain: Secondary | ICD-10-CM

## 2019-09-25 DIAGNOSIS — M545 Low back pain, unspecified: Secondary | ICD-10-CM

## 2019-09-25 DIAGNOSIS — Z131 Encounter for screening for diabetes mellitus: Secondary | ICD-10-CM

## 2019-09-25 DIAGNOSIS — G4711 Idiopathic hypersomnia with long sleep time: Secondary | ICD-10-CM

## 2019-09-25 DIAGNOSIS — Z77018 Contact with and (suspected) exposure to other hazardous metals: Secondary | ICD-10-CM

## 2019-09-25 DIAGNOSIS — Z13220 Encounter for screening for lipoid disorders: Secondary | ICD-10-CM

## 2019-09-25 NOTE — Addendum Note (Signed)
Addended by: Marrion Coy on: 09/25/2019 08:30 AM   Modules accepted: Orders

## 2019-09-26 ENCOUNTER — Other Ambulatory Visit: Payer: Self-pay | Admitting: *Deleted

## 2019-09-26 MED ORDER — VITAMIN D (ERGOCALCIFEROL) 1.25 MG (50000 UNIT) PO CAPS: 50000.0000 [IU] | ORAL_CAPSULE | ORAL | 1 refills | Status: DC

## 2019-09-27 LAB — CBC WITH DIFFERENTIAL/PLATELET
Absolute Monocytes: 508 cells/uL (ref 200–950)
Basophils Absolute: 49 cells/uL (ref 0–200)
Basophils Relative: 0.6 %
Eosinophils Absolute: 107 cells/uL (ref 15–500)
Eosinophils Relative: 1.3 %
HCT: 38 % (ref 35.0–45.0)
Hemoglobin: 12.1 g/dL (ref 11.7–15.5)
Lymphs Abs: 2386 cells/uL (ref 850–3900)
MCH: 27.2 pg (ref 27.0–33.0)
MCHC: 31.8 g/dL — ABNORMAL LOW (ref 32.0–36.0)
MCV: 85.4 fL (ref 80.0–100.0)
MPV: 9.6 fL (ref 7.5–12.5)
Monocytes Relative: 6.2 %
Neutro Abs: 5150 cells/uL (ref 1500–7800)
Neutrophils Relative %: 62.8 %
Platelets: 388 10*3/uL (ref 140–400)
RBC: 4.45 10*6/uL (ref 3.80–5.10)
RDW: 13.3 % (ref 11.0–15.0)
Total Lymphocyte: 29.1 %
WBC: 8.2 10*3/uL (ref 3.8–10.8)

## 2019-09-27 LAB — ALUMINUM LEVEL: Aluminum: 7 mcg/L — ABNORMAL HIGH (ref <7)

## 2019-09-27 LAB — HEMOGLOBIN A1C
Hgb A1c MFr Bld: 5.2 % of total Hgb (ref <5.7)
Mean Plasma Glucose: 103 (calc)
eAG (mmol/L): 5.7 (calc)

## 2019-09-27 LAB — COMPREHENSIVE METABOLIC PANEL
AG Ratio: 1.3 (calc) (ref 1.0–2.5)
ALT: 15 U/L (ref 6–29)
AST: 16 U/L (ref 10–30)
Albumin: 4 g/dL (ref 3.6–5.1)
Alkaline phosphatase (APISO): 77 U/L (ref 31–125)
BUN: 12 mg/dL (ref 7–25)
CO2: 24 mmol/L (ref 20–32)
Calcium: 8.7 mg/dL (ref 8.6–10.2)
Chloride: 105 mmol/L (ref 98–110)
Creat: 0.73 mg/dL (ref 0.50–1.10)
Globulin: 3 g/dL (calc) (ref 1.9–3.7)
Glucose, Bld: 85 mg/dL (ref 65–99)
Potassium: 4.2 mmol/L (ref 3.5–5.3)
Sodium: 138 mmol/L (ref 135–146)
Total Bilirubin: 0.6 mg/dL (ref 0.2–1.2)
Total Protein: 7 g/dL (ref 6.1–8.1)

## 2019-09-27 LAB — LIPID PANEL
Cholesterol: 184 mg/dL (ref <200)
HDL: 58 mg/dL (ref >=50)
LDL Cholesterol (Calc): 113 mg/dL (calc) — ABNORMAL HIGH
Non-HDL Cholesterol (Calc): 126 mg/dL (calc) (ref <130)
Total CHOL/HDL Ratio: 3.2 (calc) (ref <5.0)
Triglycerides: 51 mg/dL (ref <150)

## 2019-09-27 LAB — TSH: TSH: 1.71 mIU/L

## 2019-09-27 LAB — VITAMIN D 25 HYDROXY (VIT D DEFICIENCY, FRACTURES): Vit D, 25-Hydroxy: 15 ng/mL — ABNORMAL LOW (ref 30–100)

## 2019-09-27 LAB — VITAMIN B12: Vitamin B-12: 564 pg/mL (ref 200–1100)

## 2019-09-27 LAB — LEAD, BLOOD (ADULT >= 16 YRS): Lead: <1 ug/dL (ref <5)

## 2019-09-28 ENCOUNTER — Ambulatory Visit (HOSPITAL_COMMUNITY): Payer: 59 | Attending: Cardiovascular Disease

## 2019-09-28 ENCOUNTER — Other Ambulatory Visit: Payer: Self-pay

## 2019-09-28 DIAGNOSIS — I361 Nonrheumatic tricuspid (valve) insufficiency: Secondary | ICD-10-CM | POA: Diagnosis not present

## 2019-09-28 DIAGNOSIS — R600 Localized edema: Secondary | ICD-10-CM | POA: Insufficient documentation

## 2019-09-28 LAB — ECHOCARDIOGRAM COMPLETE
Area-P 1/2: 4.6 cm2
S' Lateral: 2.6 cm
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
## 2019-10-02 LAB — NICKEL, SERUM/PLASMA: Nickle,Serum/Plasma: NOT DETECTED mcg/L

## 2019-10-04 ENCOUNTER — Ambulatory Visit (INDEPENDENT_AMBULATORY_CARE_PROVIDER_SITE_OTHER): Payer: 59 | Admitting: Physical Therapy

## 2019-10-04 ENCOUNTER — Other Ambulatory Visit: Payer: Self-pay

## 2019-10-04 ENCOUNTER — Encounter: Payer: Self-pay | Admitting: Physical Therapy

## 2019-10-04 DIAGNOSIS — M5416 Radiculopathy, lumbar region: Secondary | ICD-10-CM | POA: Diagnosis not present

## 2019-10-04 DIAGNOSIS — R293 Abnormal posture: Secondary | ICD-10-CM | POA: Diagnosis not present

## 2019-10-04 DIAGNOSIS — R29898 Other symptoms and signs involving the musculoskeletal system: Secondary | ICD-10-CM | POA: Diagnosis not present
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
## 2019-10-04 NOTE — Patient Instructions (Signed)
Access Code: [email or web address] URL: [email or web address] Date: 10/04/2019 Prepared by: Faustino Congress  Exercises Left Standing Lateral Shift Correction at Wall - Repetitions - 3-5 x daily - 7 x weekly - 1 sets - 10 reps - 10 sec hold Prone on Elbows Stretch - 3-5 x daily - 7 x weekly - 1 sets - 1 reps - 3 min hold Prone Press Up on Elbows - 3-5 x daily - 7 x weekly - 1 sets - 10 reps - 10 sec hold Standing Lumbar Extension - 3-5 x daily - 7 x weekly - 1 sets - 10 reps - 10 sec hold Supine Posterior Pelvic Tilt - 3-5 x daily - 7 x weekly - 1 sets - 10 reps - 5 sec hold

## 2019-10-11 ENCOUNTER — Encounter: Payer: Self-pay | Admitting: Family Medicine

## 2019-10-12 ENCOUNTER — Other Ambulatory Visit: Payer: Self-pay | Admitting: Family Medicine

## 2019-10-18 ENCOUNTER — Ambulatory Visit (INDEPENDENT_AMBULATORY_CARE_PROVIDER_SITE_OTHER): Payer: 59 | Admitting: Obstetrics and Gynecology

## 2019-10-18 ENCOUNTER — Encounter: Payer: Self-pay | Admitting: Obstetrics and Gynecology

## 2019-10-18 ENCOUNTER — Other Ambulatory Visit: Payer: Self-pay

## 2019-10-18 ENCOUNTER — Other Ambulatory Visit (HOSPITAL_COMMUNITY)
Admission: RE | Admit: 2019-10-18 | Discharge: 2019-10-18 | Disposition: A | Discharge status: Home / Self Care | Payer: 59 | Source: Ambulatory Visit | Attending: Obstetrics and Gynecology | Admitting: Obstetrics and Gynecology

## 2019-10-18 VITALS — BP 137/90 | HR 83 | Ht 65.0 in | Wt 231.0 lb

## 2019-10-18 DIAGNOSIS — Z01419 Encounter for gynecological examination (general) (routine) without abnormal findings: Secondary | ICD-10-CM | POA: Insufficient documentation

## 2019-10-18 DIAGNOSIS — G479 Sleep disorder, unspecified: Secondary | ICD-10-CM | POA: Diagnosis not present

## 2019-10-18 DIAGNOSIS — G4710 Hypersomnia, unspecified: Secondary | ICD-10-CM | POA: Diagnosis not present

## 2019-10-18 DIAGNOSIS — Z30433 Encounter for removal and reinsertion of intrauterine contraceptive device: Secondary | ICD-10-CM

## 2019-10-18 DIAGNOSIS — D489 Neoplasm of uncertain behavior, unspecified: Secondary | ICD-10-CM

## 2019-10-18 DIAGNOSIS — Z30430 Encounter for insertion of intrauterine contraceptive device: Secondary | ICD-10-CM | POA: Diagnosis not present

## 2019-10-18 DIAGNOSIS — J45909 Unspecified asthma, uncomplicated: Secondary | ICD-10-CM | POA: Insufficient documentation

## 2019-10-18 DIAGNOSIS — G4730 Sleep apnea, unspecified: Secondary | ICD-10-CM | POA: Insufficient documentation

## 2019-10-18 DIAGNOSIS — J4520 Mild intermittent asthma, uncomplicated: Secondary | ICD-10-CM

## 2019-10-18 MED ORDER — LEVONORGESTREL 19.5 MCG/DAY IU IUD
INTRAUTERINE_SYSTEM | Freq: Once | INTRAUTERINE | Status: AC
  Administered 2019-10-18: 1 via INTRAUTERINE

## 2019-10-18 NOTE — Patient Instructions (Signed)
Levonorgestrel intrauterine device (IUD) What is this medicine? LEVONORGESTREL IUD (LEE voe nor jes trel) is a contraceptive (birth control) device. The device is placed inside the uterus by a healthcare professional. It is used to prevent pregnancy. This device can also be used to treat heavy bleeding that occurs during your period. This medicine may be used for other purposes; ask your health care provider or pharmacist if you have questions. COMMON BRAND NAME(S): Kyleena, LILETTA, Mirena, Skyla What should I tell my health care provider before I take this medicine? They need to know if you have any of these conditions:  abnormal Pap smear  cancer of the breast, uterus, or cervix  diabetes  endometritis  genital or pelvic infection now or in the past  have more than one sexual partner or your partner has more than one partner  heart disease  history of an ectopic or tubal pregnancy  immune system problems  IUD in place  liver disease or tumor  problems with blood clots or take blood-thinners  seizures  use intravenous drugs  uterus of unusual shape  vaginal bleeding that has not been explained  an unusual or allergic reaction to levonorgestrel, other hormones, silicone, or polyethylene, medicines, foods, dyes, or preservatives  pregnant or trying to get pregnant  breast-feeding How should I use this medicine? This device is placed inside the uterus by a health care professional. Talk to your pediatrician regarding the use of this medicine in children. Special care may be needed. Overdosage: If you think you have taken too much of this medicine contact a poison control center or emergency room at once. NOTE: This medicine is only for you. Do not share this medicine with others. What if I miss a dose? This does not apply. Depending on the brand of device you have inserted, the device will need to be replaced every 3 to 6 years if you wish to continue using this type  of birth control. What may interact with this medicine? Do not take this medicine with any of the following medications:  amprenavir  bosentan  fosamprenavir This medicine may also interact with the following medications:  aprepitant  armodafinil  barbiturate medicines for inducing sleep or treating seizures  bexarotene  boceprevir  griseofulvin  medicines to treat seizures like carbamazepine, ethotoin, felbamate, oxcarbazepine, phenytoin, topiramate  modafinil  pioglitazone  rifabutin  rifampin  rifapentine  some medicines to treat HIV infection like atazanavir, efavirenz, indinavir, lopinavir, nelfinavir, tipranavir, ritonavir  St. John's wort  warfarin This list may not describe all possible interactions. Give your health care provider a list of all the medicines, herbs, non-prescription drugs, or dietary supplements you use. Also tell them if you smoke, drink alcohol, or use illegal drugs. Some items may interact with your medicine. What should I watch for while using this medicine? Visit your doctor or health care professional for regular check ups. See your doctor if you or your partner has sexual contact with others, becomes HIV positive, or gets a sexual transmitted disease. This product does not protect you against HIV infection (AIDS) or other sexually transmitted diseases. You can check the placement of the IUD yourself by reaching up to the top of your vagina with clean fingers to feel the threads. Do not pull on the threads. It is a good habit to check placement after each menstrual period. Call your doctor right away if you feel more of the IUD than just the threads or if you cannot feel the threads at   all. The IUD may come out by itself. You may become pregnant if the device comes out. If you notice that the IUD has come out use a backup birth control method like condoms and call your health care provider. Using tampons will not change the position of the  IUD and are okay to use during your period. This IUD can be safely scanned with magnetic resonance imaging (MRI) only under specific conditions. Before you have an MRI, tell your healthcare provider that you have an IUD in place, and which type of IUD you have in place. What side effects may I notice from receiving this medicine? Side effects that you should report to your doctor or health care professional as soon as possible:  allergic reactions like skin rash, itching or hives, swelling of the face, lips, or tongue  fever, flu-like symptoms  genital sores  high blood pressure  no menstrual period for 6 weeks during use  pain, swelling, warmth in the leg  pelvic pain or tenderness  severe or sudden headache  signs of pregnancy  stomach cramping  sudden shortness of breath  trouble with balance, talking, or walking  unusual vaginal bleeding, discharge  yellowing of the eyes or skin Side effects that usually do not require medical attention (report to your doctor or health care professional if they continue or are bothersome):  acne  breast pain  change in sex drive or performance  changes in weight  cramping, dizziness, or faintness while the device is being inserted  headache  irregular menstrual bleeding within first 3 to 6 months of use  nausea This list may not describe all possible side effects. Call your doctor for medical advice about side effects. You may report side effects to FDA at 1-800-FDA-1088. Where should I keep my medicine? This does not apply. NOTE: This sheet is a summary. It may not cover all possible information. If you have questions about this medicine, talk to your doctor, pharmacist, or health care provider.  2020 Elsevier/Gold Standard (2018-01-10 13:22:01)  

## 2019-10-18 NOTE — Progress Notes (Signed)
GYNECOLOGY ANNUAL PREVENTATIVE CARE ENCOUNTER NOTE  History:     Kaylee Cowan is a 39 y.o. G0P0000 female here for a routine annual gynecologic exam.  Current complaints: .   Denies abnormal vaginal bleeding, discharge, pelvic pain, problems with intercourse or other gynecologic concerns.  Pt desires to have IUD removed and new IUD inserted today.  She has premedicated with ibuprofen.   Gynecologic History Patient's last menstrual period was 10/14/2019 (exact date). Contraception: IUD Last Pap: 2019. Results were: normal with negative HPV Last mammogram: n/a.  Obstetric History OB History  Gravida Para Term Preterm AB Living  0 0 0 0 0 0  SAB TAB Ectopic Multiple Live Births  0 0 0 0 0    Past Medical History:  Diagnosis Date  . Anemia   . Asthma   . Giant cell tumor   . Hypersomnolence   . Migraines   . Sleep disorder     Past Surgical History:  Procedure Laterality Date  . MASS EXCISION Right 05/16/2019   Procedure: Excision right wrist volar mass;  Surgeon: Iran Planas, MD;  Location: [place];  Service: Orthopedics;  Laterality: Right;  with IV sedation  . NASAL SEPTUM SURGERY    . REFRACTIVE SURGERY    . ROTATOR CUFF REPAIR    . TONSILLECTOMY      Current Outpatient Medications on File Prior to Visit  Medication Sig Dispense Refill  . Ferrous Fumarate (IRON) 18 MG TBCR Take by mouth.    . levonorgestrel (MIRENA) 20 MCG/24HR IUD 1 each by Intrauterine route once.    . Magnesium 500 MG CAPS Take 50,000 mg by mouth once a week.    . Multiple Vitamin (MULTIVITAMIN ADULT) TABS Take by mouth.    . Vitamin D, Ergocalciferol, (DRISDOL) 1.25 MG (50000 UNIT) CAPS capsule Take 50,000 Units by mouth every 7 (seven) days.     No current facility-administered medications on file prior to visit.    Allergies  Allergen Reactions  . Bee Venom Anaphylaxis    Social History:  reports that she has never smoked. She has never used smokeless  tobacco. She reports previous alcohol use. She reports that she does not use drugs.  Family History  Problem Relation Age of Onset  . Diabetes Mother   . Hypertension Father   . Hyperlipidemia Father   . Early death Maternal Grandmother   . Early death Maternal Grandfather   . Heart attack Paternal Grandmother     The following portions of the patient's history were reviewed and updated as appropriate: allergies, current medications, past family history, past medical history, past social history, past surgical history and problem list.  Review of Systems Pertinent items noted in HPI and remainder of comprehensive ROS otherwise negative.  Physical Exam:  BP 137/90   Pulse 83   Ht 5\' 5"  (1.651 m)   Wt 231 lb (104.8 kg)   LMP 10/14/2019 (Exact Date)   BMI 38.44 kg/m  CONSTITUTIONAL: Well-developed, well-nourished female in no acute distress.  HENT:  Normocephalic, atraumatic, External right and left ear normal. Oropharynx is clear and moist EYES: Conjunctivae and EOM are normal. Pupils are equal, round, and reactive to light. No scleral icterus.  NECK: Normal range of motion, supple, no masses.  Normal thyroid.  SKIN: Skin is warm and dry. No rash noted. Not diaphoretic. No erythema. No pallor. MUSCULOSKELETAL: Normal range of motion. No tenderness.  No cyanosis, clubbing, or edema.  2+ distal pulses.  NEUROLOGIC: Alert and oriented to person, place, and time. Normal reflexes, muscle tone coordination.  PSYCHIATRIC: Normal mood and affect. Normal behavior. Normal judgment and thought content. CARDIOVASCULAR: Normal heart rate noted, regular rhythm RESPIRATORY: Clear to auscultation bilaterally. Effort and breath sounds normal, no problems with respiration noted. BREASTS: Symmetric in size. No masses, tenderness, skin changes, nipple drainage, or lymphadenopathy bilaterally. Performed in the presence of a chaperone. ABDOMEN: Soft, no distention noted.  No tenderness, rebound or  guarding.  PELVIC: Normal appearing external genitalia and urethral meatus; normal appearing vaginal mucosa and cervix.  No abnormal discharge noted.  Pap smear obtained.  Normal uterine size, no other palpable masses, no uterine or adnexal tenderness. IUD strings easily seen Performed in the presence of a chaperone.   Assessment and Plan:    1. Well woman exam with routine gynecological exam F/u in 1year pending pap results - Cytology - PAP( [facility])  2. Sleep disorder   3. Hypersomnolence   4. Mild intermittent asthma without complication   5. Giant cell tumor   6. Encounter for insertion of intrauterine contraceptive device (IUD) F/u in 1 month for string check - levonorgestrel (LILETTA) 19.5 MCG/DAY IUD  7. Encounter for removal and reinsertion of intrauterine contraceptive device. See separate note for procedure  Will follow up results of pap smear and manage accordingly. Mammogram scheduled Routine preventative health maintenance measures emphasized. Please refer to After Visit Summary for other counseling recommendations.      Lynnda Shields, MD, [place] for [facility], [place]

## 2019-10-18 NOTE — Progress Notes (Signed)
    GYNECOLOGY OFFICE PROCEDURE NOTE  Kaylee Cowan is a 39 y.o. G0P0000 here for  IUD removal. No GYN concerns.  Last pap smear was on 2019  and was normal.  IUD Removal  Patient identified, informed consent performed, consent signed.  Patient was in the dorsal lithotomy position, normal external genitalia was noted.  A speculum was placed in the patient's vagina, normal discharge was noted, no lesions. The cervix was visualized, no lesions, no abnormal discharge.  The strings of the IUD were grasped and pulled using ring forceps. The IUD was removed in its entirety. Patient tolerated the procedure well.    Patient will use IUD for contraception/does not plan. for pregnancy soon.   Routine preventative health maintenance measures emphasized.     IUD Insertion Procedure Note Patient identified, informed consent performed, consent signed.   Discussed risks of irregular bleeding, cramping, infection, malpositioning or misplacement of the IUD outside the uterus which may require further procedure such as laparoscopy. Also discussed >99% contraception efficacy, increased risk of ectopic pregnancy with failure of method.  Time out was performed.  Urine pregnancy test negative.  Speculum placed in the vagina.  Cervix visualized.  Cleaned with Betadine x 2.  Grasped anteriorly with a single tooth tenaculum.  Uterus sounded to 7 cm.  Liletta IUD placed per manufacturer's recommendations.  Strings trimmed to 3 cm. Tenaculum was removed, good hemostasis noted.  Patient tolerated procedure well.   Patient was given post-procedure instructions.  She was advised to have backup contraception for one week.  Patient was also asked to check IUD strings periodically and follow up in 4 weeks for IUD check.   Lynnda Shields, MD, [place] for [facility], [place]

## 2019-10-22 ENCOUNTER — Other Ambulatory Visit: Payer: Self-pay

## 2019-10-22 ENCOUNTER — Ambulatory Visit (INDEPENDENT_AMBULATORY_CARE_PROVIDER_SITE_OTHER): Payer: 59 | Admitting: Physical Therapy

## 2019-10-22 ENCOUNTER — Encounter: Payer: Self-pay | Admitting: Physical Therapy

## 2019-10-22 DIAGNOSIS — M5416 Radiculopathy, lumbar region: Secondary | ICD-10-CM | POA: Diagnosis not present

## 2019-10-22 DIAGNOSIS — R293 Abnormal posture: Secondary | ICD-10-CM

## 2019-10-22 DIAGNOSIS — R29898 Other symptoms and signs involving the musculoskeletal system: Secondary | ICD-10-CM | POA: Diagnosis not present

## 2019-10-22 LAB — CYTOLOGY - PAP
Comment: NEGATIVE
Diagnosis: NEGATIVE
High risk HPV: NEGATIVE
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
## 2019-10-22 NOTE — Patient Instructions (Signed)
Access Code: [email or web address] URL: [email or web address] Date: 10/22/2019 Prepared by: Faustino Congress  Exercises Left Standing Lateral Shift Correction at Wall - Repetitions - 3-5 x daily - 7 x weekly - 1 sets - 10 reps - 10 sec hold Prone on Elbows Stretch - 3-5 x daily - 7 x weekly - 1 sets - 1 reps - 3 min hold Prone Press Up on Elbows - 3-5 x daily - 7 x weekly - 1 sets - 10 reps - 10 sec hold Standing Lumbar Extension - 3-5 x daily - 7 x weekly - 1 sets - 10 reps - 10 sec hold Supine Posterior Pelvic Tilt - 3-5 x daily - 7 x weekly - 1 sets - 10 reps - 5 sec hold Supine Piriformis Stretch with Foot on Ground - 2 x daily - 7 x weekly - 3 reps - 1 sets - 30 sec hold Piriformis Mobilization with Small Ball - 2-3 x daily - 7 x weekly - 1 sets - 1 reps - 20-30 sec hold

## 2019-10-29 ENCOUNTER — Other Ambulatory Visit: Payer: Self-pay

## 2019-10-29 ENCOUNTER — Ambulatory Visit (INDEPENDENT_AMBULATORY_CARE_PROVIDER_SITE_OTHER): Payer: 59 | Admitting: Neurology

## 2019-10-29 DIAGNOSIS — G4733 Obstructive sleep apnea (adult) (pediatric): Secondary | ICD-10-CM

## 2019-10-29 DIAGNOSIS — G4719 Other hypersomnia: Secondary | ICD-10-CM

## 2019-10-29 DIAGNOSIS — G4711 Idiopathic hypersomnia with long sleep time: Secondary | ICD-10-CM

## 2019-10-30 ENCOUNTER — Ambulatory Visit (INDEPENDENT_AMBULATORY_CARE_PROVIDER_SITE_OTHER): Payer: 59 | Admitting: Physical Therapy

## 2019-10-30 ENCOUNTER — Other Ambulatory Visit: Payer: Self-pay

## 2019-10-30 DIAGNOSIS — M5416 Radiculopathy, lumbar region: Secondary | ICD-10-CM | POA: Diagnosis not present

## 2019-10-30 DIAGNOSIS — R29898 Other symptoms and signs involving the musculoskeletal system: Secondary | ICD-10-CM | POA: Diagnosis not present

## 2019-10-30 DIAGNOSIS — R293 Abnormal posture: Secondary | ICD-10-CM
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
## 2019-11-06 ENCOUNTER — Other Ambulatory Visit: Payer: Self-pay

## 2019-11-06 ENCOUNTER — Ambulatory Visit (INDEPENDENT_AMBULATORY_CARE_PROVIDER_SITE_OTHER): Payer: 59 | Admitting: Physical Therapy

## 2019-11-06 ENCOUNTER — Telehealth: Payer: Self-pay | Admitting: Neurology

## 2019-11-06 ENCOUNTER — Encounter: Payer: Self-pay | Admitting: Physical Therapy

## 2019-11-06 DIAGNOSIS — M5416 Radiculopathy, lumbar region: Secondary | ICD-10-CM

## 2019-11-06 DIAGNOSIS — R29898 Other symptoms and signs involving the musculoskeletal system: Secondary | ICD-10-CM

## 2019-11-06 DIAGNOSIS — R293 Abnormal posture: Secondary | ICD-10-CM | POA: Diagnosis not present

## 2019-11-06 NOTE — Telephone Encounter (Signed)
I will contact the patient once it has been reviewed.

## 2019-11-06 NOTE — Telephone Encounter (Signed)
Pt called about her hst results. Please advise.
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
## 2019-11-08 ENCOUNTER — Encounter: Payer: Self-pay | Admitting: Neurology

## 2019-11-08 DIAGNOSIS — G4733 Obstructive sleep apnea (adult) (pediatric): Secondary | ICD-10-CM | POA: Insufficient documentation

## 2019-11-08 DIAGNOSIS — G4719 Other hypersomnia: Secondary | ICD-10-CM | POA: Insufficient documentation
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
## 2019-11-08 NOTE — Progress Notes (Signed)
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 The patient has endorsed a  high Epworth score.  The REM sleep latency is very brief as indicated in the HST less,  than 30 minutes.   Recommendations:    The apnea needs to be treated first, and we need to recheck on  Epworth score after 3 month of CPAP use.  The auto-CPAP settings will be 5-15 cm water , 1 cm EPR and mask  of patient's choice, to be fitted in person.   The patient should follow up in a 15 minute virtual visit , if desired, with NP>   Interpreting Physician: Jeb Levering, MD

## 2019-11-08 NOTE — Addendum Note (Signed)
Addended by: Larey Seat on: 11/08/2019 04:52 PM   Modules accepted: Orders

## 2019-11-09 NOTE — Progress Notes (Signed)
Noted  

## 2019-11-13 ENCOUNTER — Encounter: Payer: Self-pay | Admitting: Physical Therapy

## 2019-11-13 ENCOUNTER — Other Ambulatory Visit: Payer: Self-pay

## 2019-11-13 ENCOUNTER — Ambulatory Visit (INDEPENDENT_AMBULATORY_CARE_PROVIDER_SITE_OTHER): Payer: 59 | Admitting: Physical Therapy

## 2019-11-13 DIAGNOSIS — R29898 Other symptoms and signs involving the musculoskeletal system: Secondary | ICD-10-CM

## 2019-11-13 DIAGNOSIS — M5416 Radiculopathy, lumbar region: Secondary | ICD-10-CM

## 2019-11-13 DIAGNOSIS — R293 Abnormal posture: Secondary | ICD-10-CM
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
## 2019-11-14 ENCOUNTER — Other Ambulatory Visit: Payer: Self-pay | Admitting: Neurology

## 2019-11-14 DIAGNOSIS — G4710 Hypersomnia, unspecified: Secondary | ICD-10-CM

## 2019-11-14 DIAGNOSIS — G43711 Chronic migraine without aura, intractable, with status migrainosus: Secondary | ICD-10-CM

## 2019-11-14 DIAGNOSIS — Z6837 Body mass index (BMI) 37.0-37.9, adult: Secondary | ICD-10-CM

## 2019-11-14 DIAGNOSIS — G4719 Other hypersomnia: Secondary | ICD-10-CM

## 2019-11-14 DIAGNOSIS — E6609 Other obesity due to excess calories: Secondary | ICD-10-CM

## 2019-11-14 DIAGNOSIS — G4730 Sleep apnea, unspecified: Secondary | ICD-10-CM

## 2019-11-14 MED ORDER — TOPIRAMATE 50 MG PO TABS
50.0000 mg | ORAL_TABLET | Freq: Every evening | ORAL | 1 refills | Status: DC
Start: 2019-11-14 — End: 2020-07-07

## 2019-11-20 ENCOUNTER — Ambulatory Visit (INDEPENDENT_AMBULATORY_CARE_PROVIDER_SITE_OTHER): Payer: 59 | Admitting: Orthopaedic Surgery

## 2019-11-20 DIAGNOSIS — M545 Low back pain, unspecified: Secondary | ICD-10-CM | POA: Insufficient documentation

## 2019-11-20 NOTE — Progress Notes (Signed)
Office Visit Note   Patient: Kaylee Cowan           Date of Birth: Aug 05, 1980           MRN: [id number] Visit Date: 11/20/2019              Requested by: Caren Macadam, MD [place],  [place] [address] PCP: Caren Macadam, MD   Assessment & Plan: Visit Diagnoses:  1. Acute low back pain, unspecified back pain laterality, unspecified whether sciatica present     Plan: Patient will finish up her physical therapy.  She is happy the results of treatment and can follow-up with me on an as-needed basis.  Follow-Up Instructions: Return if symptoms worsen or fail to improve.   Orders:  No orders of the defined types were placed in this encounter.  No orders of the defined types were placed in this encounter.     Procedures: No procedures performed   Clinical Data: No additional findings.   Subjective: Chief Complaint  Patient presents with  . Lower Back - Follow-up    HPI 39 year old female returns for following up low back pain.  She is done physical therapy sadly 6 visits states she is gotten 80% plus relief.  She also started back on her Topamax for migraines and not sure if this is helped contribute to improvement.  She is moving better.  She gets from sitting to standing easier and states activities of daily living are significantly improved.  Patient is noted some pain that shoots down her thoracic spine with extreme forward flexion of her neck.  No problems with extension or rotation.  Review of Systems all other systems are negative as pertains HPI.  Updated from 09/25/2019 office visit.   Objective: Vital Signs: There were no vitals taken for this visit.  Physical Exam Constitutional:      Appearance: She is well-developed.  HENT:     Head: Normocephalic.     Right Ear: External ear normal.     Left Ear: External ear normal.  Eyes:     Pupils: Pupils are equal, round, and reactive to light.  Neck:     Thyroid: No  thyromegaly.     Trachea: No tracheal deviation.  Cardiovascular:     Rate and Rhythm: Normal rate.  Pulmonary:     Effort: Pulmonary effort is normal.  Abdominal:     Palpations: Abdomen is soft.  Skin:    General: Skin is warm and dry.  Neurological:     Mental Status: She is alert and oriented to person, place, and time.  Psychiatric:        Behavior: Behavior normal.     Ortho Exam negative straight leg raising negative logroll the hips knee and ankle jerk are intact she can easily heel and toe walk she gets from sitting standing comfortably and rapidly.  No isolated motor weakness.  Specialty Comments:  No specialty comments available.  Imaging: No results found.   PMFS History: Patient Active Problem List   Diagnosis Date Noted  . Low back pain 11/20/2019  . Mild obstructive sleep apnea 11/08/2019  . Excessive daytime sleepiness 11/08/2019  . Encounter for removal and reinsertion of intrauterine contraceptive device (IUD) 10/18/2019  . Women's annual routine gynecological examination 10/18/2019  . Sleep disorder   . Hypersomnolence   . Asthma   . Giant cell tumor   . Lower extremity edema 08/21/2019  . Peripheral vasoconstriction ([place]) 08/21/2019  .  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 Migraine 08/21/2019   Past Medical History:  Diagnosis Date  . Anemia   . Asthma   . Giant cell tumor   . Hypersomnolence   . Migraines   . Sleep disorder     Family History  Problem Relation Age of Onset  . Diabetes Mother   . Hypertension Father   . Hyperlipidemia Father   . Early death Maternal Grandmother   . Early death Maternal Grandfather   . Heart attack Paternal Grandmother     Past Surgical History:  Procedure Laterality Date  . MASS EXCISION Right 05/16/2019   Procedure: Excision right wrist volar mass;  Surgeon: Iran Planas, MD;  Location: [place];  Service: Orthopedics;  Laterality: Right;  with IV sedation  . NASAL SEPTUM SURGERY      . REFRACTIVE SURGERY    . ROTATOR CUFF REPAIR    . TONSILLECTOMY     Social History   Occupational History  . Not on file  Tobacco Use  . Smoking status: Never Smoker  . Smokeless tobacco: Never Used  Vaping Use  . Vaping Use: Never used  Substance and Sexual Activity  . Alcohol use: Not Currently  . Drug use: Never  . Sexual activity: Not Currently    Birth control/protection: I.U.D.

## 2019-11-21 ENCOUNTER — Ambulatory Visit: Payer: 59 | Admitting: Obstetrics and Gynecology

## 2019-11-28 ENCOUNTER — Other Ambulatory Visit: Payer: Self-pay

## 2019-11-28 ENCOUNTER — Ambulatory Visit (INDEPENDENT_AMBULATORY_CARE_PROVIDER_SITE_OTHER): Payer: 59 | Admitting: Obstetrics and Gynecology

## 2019-11-28 ENCOUNTER — Encounter: Payer: Self-pay | Admitting: Obstetrics and Gynecology

## 2019-11-28 VITALS — BP 127/89 | HR 85

## 2019-11-28 DIAGNOSIS — Z975 Presence of (intrauterine) contraceptive device: Secondary | ICD-10-CM | POA: Insufficient documentation

## 2019-11-28 DIAGNOSIS — Z23 Encounter for immunization: Secondary | ICD-10-CM | POA: Insufficient documentation

## 2019-11-28 NOTE — Patient Instructions (Signed)
Levonorgestrel intrauterine device (IUD) What is this medicine? LEVONORGESTREL IUD (LEE voe nor jes trel) is a contraceptive (birth control) device. The device is placed inside the uterus by a healthcare professional. It is used to prevent pregnancy. This device can also be used to treat heavy bleeding that occurs during your period. This medicine may be used for other purposes; ask your health care provider or pharmacist if you have questions. COMMON BRAND NAME(S): Kyleena, LILETTA, Mirena, Skyla What should I tell my health care provider before I take this medicine? They need to know if you have any of these conditions:  abnormal Pap smear  cancer of the breast, uterus, or cervix  diabetes  endometritis  genital or pelvic infection now or in the past  have more than one sexual partner or your partner has more than one partner  heart disease  history of an ectopic or tubal pregnancy  immune system problems  IUD in place  liver disease or tumor  problems with blood clots or take blood-thinners  seizures  use intravenous drugs  uterus of unusual shape  vaginal bleeding that has not been explained  an unusual or allergic reaction to levonorgestrel, other hormones, silicone, or polyethylene, medicines, foods, dyes, or preservatives  pregnant or trying to get pregnant  breast-feeding How should I use this medicine? This device is placed inside the uterus by a health care professional. Talk to your pediatrician regarding the use of this medicine in children. Special care may be needed. Overdosage: If you think you have taken too much of this medicine contact a poison control center or emergency room at once. NOTE: This medicine is only for you. Do not share this medicine with others. What if I miss a dose? This does not apply. Depending on the brand of device you have inserted, the device will need to be replaced every 3 to 6 years if you wish to continue using this type  of birth control. What may interact with this medicine? Do not take this medicine with any of the following medications:  amprenavir  bosentan  fosamprenavir This medicine may also interact with the following medications:  aprepitant  armodafinil  barbiturate medicines for inducing sleep or treating seizures  bexarotene  boceprevir  griseofulvin  medicines to treat seizures like carbamazepine, ethotoin, felbamate, oxcarbazepine, phenytoin, topiramate  modafinil  pioglitazone  rifabutin  rifampin  rifapentine  some medicines to treat HIV infection like atazanavir, efavirenz, indinavir, lopinavir, nelfinavir, tipranavir, ritonavir  St. John's wort  warfarin This list may not describe all possible interactions. Give your health care provider a list of all the medicines, herbs, non-prescription drugs, or dietary supplements you use. Also tell them if you smoke, drink alcohol, or use illegal drugs. Some items may interact with your medicine. What should I watch for while using this medicine? Visit your doctor or health care professional for regular check ups. See your doctor if you or your partner has sexual contact with others, becomes HIV positive, or gets a sexual transmitted disease. This product does not protect you against HIV infection (AIDS) or other sexually transmitted diseases. You can check the placement of the IUD yourself by reaching up to the top of your vagina with clean fingers to feel the threads. Do not pull on the threads. It is a good habit to check placement after each menstrual period. Call your doctor right away if you feel more of the IUD than just the threads or if you cannot feel the threads at   all. The IUD may come out by itself. You may become pregnant if the device comes out. If you notice that the IUD has come out use a backup birth control method like condoms and call your health care provider. Using tampons will not change the position of the  IUD and are okay to use during your period. This IUD can be safely scanned with magnetic resonance imaging (MRI) only under specific conditions. Before you have an MRI, tell your healthcare provider that you have an IUD in place, and which type of IUD you have in place. What side effects may I notice from receiving this medicine? Side effects that you should report to your doctor or health care professional as soon as possible:  allergic reactions like skin rash, itching or hives, swelling of the face, lips, or tongue  fever, flu-like symptoms  genital sores  high blood pressure  no menstrual period for 6 weeks during use  pain, swelling, warmth in the leg  pelvic pain or tenderness  severe or sudden headache  signs of pregnancy  stomach cramping  sudden shortness of breath  trouble with balance, talking, or walking  unusual vaginal bleeding, discharge  yellowing of the eyes or skin Side effects that usually do not require medical attention (report to your doctor or health care professional if they continue or are bothersome):  acne  breast pain  change in sex drive or performance  changes in weight  cramping, dizziness, or faintness while the device is being inserted  headache  irregular menstrual bleeding within first 3 to 6 months of use  nausea This list may not describe all possible side effects. Call your doctor for medical advice about side effects. You may report side effects to FDA at 1-800-FDA-1088. Where should I keep my medicine? This does not apply. NOTE: This sheet is a summary. It may not cover all possible information. If you have questions about this medicine, talk to your doctor, pharmacist, or health care provider.  2020 Elsevier/Gold Standard (2018-01-10 13:22:01)  

## 2019-11-28 NOTE — Progress Notes (Signed)
° °  Subjective:    Patient ID: Kaylee Cowan is a 39 y.o. female presenting with IUD String Check  on 11/28/2019  HPI: Pt seen.  Liletta IUD was placed on 10/18/2019.  Pt had 1-2 days of cramping afterwards and no issues since then.  She has been doing well.  Pt also desires flu vaccine  Review of Systems    Objective:    BP 127/89    Pulse 85  Physical Exam SSE: IUD strings easily seen 3-4 cm outside of cervical os     Assessment & Plan:   Presence of IUD: IUD normally placed, f/u in 1 year for AE/pap  Encounter administration of vaccine:  Pt received flu vaccine with no issues No follow-ups on file.  Griffin Basil 11/28/2019 9:52 AM

## 2020-01-04 ENCOUNTER — Encounter: Payer: Self-pay | Admitting: Family Medicine

## 2020-01-04 ENCOUNTER — Ambulatory Visit: Payer: 59 | Admitting: Family Medicine

## 2020-01-04 ENCOUNTER — Other Ambulatory Visit: Payer: Self-pay

## 2020-01-04 VITALS — BP 108/80 | HR 95 | Temp 98.0°F | Ht 65.0 in | Wt 226.8 lb

## 2020-01-04 DIAGNOSIS — Z23 Encounter for immunization: Secondary | ICD-10-CM | POA: Diagnosis not present

## 2020-01-04 DIAGNOSIS — G479 Sleep disorder, unspecified: Secondary | ICD-10-CM | POA: Diagnosis not present

## 2020-01-04 DIAGNOSIS — G43909 Migraine, unspecified, not intractable, without status migrainosus: Secondary | ICD-10-CM | POA: Diagnosis not present

## 2020-01-04 NOTE — Progress Notes (Signed)
Kaylee Cowan DOB: January 25, 1981 Encounter date: 01/04/2020  This is a 39 y.o. female who presents with Chief Complaint  Patient presents with   Follow-up    History of present illness: Mega-dose vitamin caused some stomach upset. Worse after eating, more burping. Didn't have this before.   Migraines: doing well on topamax and getting maybe 2 migraines/month.   Swelling in legs has gotten better. Numbness in left leg is about 90 percent better. Not having to wear compression stockings now.   Following with Dr. Elgie Congo for gyn care; had IUD replaced.   Worked on PT for back; which she feels helped legs as well. Continues to do exercises at home.   She is really feeling well overall.   Allergies  Allergen Reactions   Bee Venom Anaphylaxis   Current Meds  Medication Sig   Ferrous Fumarate (IRON) 18 MG TBCR Take by mouth.   levonorgestrel (LILETTA, 52 MG,) 19.5 MCG/DAY IUD IUD 1 each by Intrauterine route once.   magnesium 30 MG tablet Take 30 mg by mouth daily.   Multiple Vitamin (MULTIVITAMIN ADULT) TABS Take by mouth.   topiramate (TOPAMAX) 50 MG tablet Take 1 tablet (50 mg total) by mouth at bedtime.   VITAMIN D PO Take 500 Units by mouth daily.   [DISCONTINUED] Vitamin D, Ergocalciferol, (DRISDOL) 1.25 MG (50000 UNIT) CAPS capsule Take 50,000 Units by mouth every 7 (seven) days.    Review of Systems  Constitutional: Negative for chills, fatigue and fever.  Respiratory: Negative for cough, chest tightness, shortness of breath and wheezing.   Cardiovascular: Negative for chest pain, palpitations and leg swelling.  Neurological: Numbness: almost resolved. Headaches: infrequent, improved.    Objective:  BP 108/80 (BP Location: Left Arm, Patient Position: Sitting, Cuff Size: Large)    Pulse 95    Temp 98 F (36.7 C) (Oral)    Ht 5\' 5"  (1.651 m)    Wt 226 lb 12.8 oz (102.9 kg)    BMI 37.74 kg/m   Weight: 226 lb 12.8 oz (102.9 kg)   BP Readings from Last 3  Encounters:  01/04/20 108/80  11/28/19 127/89  10/18/19 137/90   Wt Readings from Last 3 Encounters:  01/04/20 226 lb 12.8 oz (102.9 kg)  10/18/19 231 lb (104.8 kg)  09/25/19 230 lb (104.3 kg)    Physical Exam Constitutional:      General: She is not in acute distress.    Appearance: She is well-developed.  Cardiovascular:     Rate and Rhythm: Normal rate and regular rhythm.     Heart sounds: Normal heart sounds. No murmur heard.  No friction rub.  Pulmonary:     Effort: Pulmonary effort is normal. No respiratory distress.     Breath sounds: Normal breath sounds. No wheezing or rales.  Musculoskeletal:     Right lower leg: No edema.     Left lower leg: No edema.  Neurological:     Mental Status: She is alert and oriented to person, place, and time.  Psychiatric:        Behavior: Behavior normal.     Assessment/Plan 1. Need for Tdap vaccination - Tdap vaccine greater than or equal to 7yo IM  2. Migraine without status migrainosus, not intractable, unspecified migraine type She is doing great with the Topamax.  Continue this.  Migraines have significantly improved.  3. Sleep disorder She is working with sleep specialist and insurance to get evaluation needed to help with sleep quality.   Return  in about 6 months (around 07/04/2020) for physical exam.      Micheline Rough, MD

## 2020-02-29 ENCOUNTER — Other Ambulatory Visit: Payer: Self-pay

## 2020-02-29 ENCOUNTER — Ambulatory Visit
Admission: EM | Admit: 2020-02-29 | Discharge: 2020-02-29 | Disposition: A | Discharge status: Home / Self Care | Payer: 59 | Attending: Urgent Care | Admitting: Urgent Care

## 2020-02-29 DIAGNOSIS — R062 Wheezing: Secondary | ICD-10-CM

## 2020-02-29 DIAGNOSIS — J4530 Mild persistent asthma, uncomplicated: Secondary | ICD-10-CM

## 2020-02-29 DIAGNOSIS — R0602 Shortness of breath: Secondary | ICD-10-CM

## 2020-02-29 MED ORDER — PROMETHAZINE-DM 6.25-15 MG/5ML PO SYRP: 5.0000 mL | ORAL_SOLUTION | Freq: Every evening | ORAL | 0 refills | Status: DC | PRN

## 2020-02-29 MED ORDER — ALBUTEROL SULFATE HFA 108 (90 BASE) MCG/ACT IN AERS: 1.0000 | INHALATION_SPRAY | Freq: Four times a day (QID) | RESPIRATORY_TRACT | 0 refills | Status: DC | PRN

## 2020-02-29 MED ORDER — BENZONATATE 100 MG PO CAPS
100.0000 mg | ORAL_CAPSULE | Freq: Three times a day (TID) | ORAL | 0 refills | Status: DC | PRN
Start: 2020-02-29 — End: 2020-07-07

## 2020-02-29 MED ORDER — METHYLPREDNISOLONE ACETATE 80 MG/ML IJ SUSP: 80.0000 mg | Freq: Once | INTRAMUSCULAR | Status: DC

## 2020-02-29 MED ORDER — CETIRIZINE HCL 10 MG PO TABS: 10.0000 mg | ORAL_TABLET | Freq: Every day | ORAL | 0 refills | Status: DC

## 2020-02-29 NOTE — ED Triage Notes (Signed)
Pt c/o sob/wheezing x2hrs and out of inhaler. Speaking in complete sentences. States her chest feels like it's on fire. No distress.

## 2020-02-29 NOTE — ED Provider Notes (Signed)
[place]   MRN: [id number] DOB: 1981-02-05  Subjective:   Kaylee Cowan is a 39 y.o. female presenting for acute onset about 2 hours ago of shortness of breath, wheezing, chest tightness and burning sensation within her chest.  She has now started to have coughing fits.  Has a history of asthma but has not used this in the past year.  She declined a refill last time with her PCP.  Denies fever, runny or stuffy nose, sore throat, body aches, malaise.  She is Covid vaccinated, has had her booster shot.  Denies smoking cigarettes.  No current facility-administered medications for this encounter.  Current Outpatient Medications:  .  Ferrous Fumarate (IRON) 18 MG TBCR, Take by mouth., Disp: , Rfl:  .  levonorgestrel (LILETTA, 52 MG,) 19.5 MCG/DAY IUD IUD, 1 each by Intrauterine route once., Disp: , Rfl:  .  magnesium 30 MG tablet, Take 30 mg by mouth daily., Disp: , Rfl:  .  Multiple Vitamin (MULTIVITAMIN ADULT) TABS, Take by mouth., Disp: , Rfl:  .  topiramate (TOPAMAX) 50 MG tablet, Take 1 tablet (50 mg total) by mouth at bedtime., Disp: 90 tablet, Rfl: 1 .  VITAMIN D PO, Take 500 Units by mouth daily., Disp: , Rfl:    Allergies  Allergen Reactions  . Bee Venom Anaphylaxis    Past Medical History:  Diagnosis Date  . Anemia   . Asthma   . Giant cell tumor   . Hypersomnolence   . Migraines   . Sleep disorder      Past Surgical History:  Procedure Laterality Date  . MASS EXCISION Right 05/16/2019   Procedure: Excision right wrist volar mass;  Surgeon: Iran Planas, MD;  Location: [facility];  Service: Orthopedics;  Laterality: Right;  with IV sedation  . NASAL SEPTUM SURGERY    . REFRACTIVE SURGERY    . ROTATOR CUFF REPAIR    . TONSILLECTOMY      Family History  Problem Relation Age of Onset  . Diabetes Mother   . Hypertension Father   . Hyperlipidemia Father   . Early death Maternal Grandmother   . Early death Maternal Grandfather    . Heart attack Paternal Grandmother     Social History   Tobacco Use  . Smoking status: Never Smoker  . Smokeless tobacco: Never Used  Vaping Use  . Vaping Use: Never used  Substance Use Topics  . Alcohol use: Not Currently  . Drug use: Never    ROS   Objective:   Vitals: BP (!) 146/91 (BP Location: Left Arm)   Pulse 88   Temp 98.2 F (36.8 C) (Oral)   Resp 20   SpO2 98%   Physical Exam Constitutional:      General: She is not in acute distress.    Appearance: Normal appearance. She is well-developed. She is not ill-appearing, toxic-appearing or diaphoretic.  HENT:     Head: Normocephalic and atraumatic.     Nose: Nose normal.     Mouth/Throat:     Mouth: Mucous membranes are moist.  Eyes:     Extraocular Movements: Extraocular movements intact.     Pupils: Pupils are equal, round, and reactive to light.  Cardiovascular:     Rate and Rhythm: Normal rate and regular rhythm.     Pulses: Normal pulses.     Heart sounds: Normal heart sounds. No murmur heard. No friction rub. No gallop.   Pulmonary:     Effort:  Pulmonary effort is normal. No respiratory distress.     Breath sounds: No stridor. Examination of the right-middle field reveals wheezing. Examination of the left-middle field reveals wheezing. Wheezing (mild) present. No rhonchi or rales.  Skin:    General: Skin is warm and dry.     Findings: No rash.  Neurological:     Mental Status: She is alert and oriented to person, place, and time.  Psychiatric:        Mood and Affect: Mood normal.        Behavior: Behavior normal.        Thought Content: Thought content normal.      Assessment and Plan :   PDMP not reviewed this encounter.  1. Mild persistent asthma without complication   2. Wheezing   3. Shortness of breath     Patient declined COVID-19 testing.  I am in agreement, suspect that this is more her asthma.  IM Depo-Medrol in clinic, refilled her albuterol inhaler and offered supportive  care.  Physical exam findings and vital signs otherwise stable for outpatient management. Counseled patient on potential for adverse effects with medications prescribed/recommended today, ER and return-to-clinic precautions discussed, patient verbalized understanding.    Jaynee Eagles, [place] 02/29/20 1922

## 2020-03-23 ENCOUNTER — Other Ambulatory Visit: Payer: Self-pay

## 2020-03-23 ENCOUNTER — Emergency Department (HOSPITAL_COMMUNITY): Payer: Self-pay

## 2020-03-23 ENCOUNTER — Emergency Department (HOSPITAL_COMMUNITY)
Admission: EM | Admit: 2020-03-23 | Discharge: 2020-03-23 | Disposition: A | Discharge status: Home / Self Care | Payer: Self-pay | Attending: Emergency Medicine | Admitting: Emergency Medicine

## 2020-03-23 ENCOUNTER — Encounter (HOSPITAL_COMMUNITY): Payer: Self-pay

## 2020-03-23 DIAGNOSIS — J45909 Unspecified asthma, uncomplicated: Secondary | ICD-10-CM | POA: Insufficient documentation

## 2020-03-23 DIAGNOSIS — R059 Cough, unspecified: Secondary | ICD-10-CM | POA: Insufficient documentation

## 2020-03-23 DIAGNOSIS — L509 Urticaria, unspecified: Secondary | ICD-10-CM | POA: Insufficient documentation

## 2020-03-23 DIAGNOSIS — Z20822 Contact with and (suspected) exposure to covid-19: Secondary | ICD-10-CM | POA: Insufficient documentation

## 2020-03-23 DIAGNOSIS — J9801 Acute bronchospasm: Secondary | ICD-10-CM

## 2020-03-23 LAB — RESP PANEL BY RT-PCR (FLU A&B, COVID) ARPGX2
Influenza A by PCR: NEGATIVE
Influenza B by PCR: NEGATIVE
SARS Coronavirus 2 by RT PCR: NEGATIVE

## 2020-03-23 MED ORDER — EPINEPHRINE 0.3 MG/0.3ML IJ SOAJ: 0.3000 mg | Freq: Once | INTRAMUSCULAR | 1 refills | Status: AC | PRN

## 2020-03-23 MED ORDER — ALBUTEROL SULFATE HFA 108 (90 BASE) MCG/ACT IN AERS
1.0000 | INHALATION_SPRAY | RESPIRATORY_TRACT | 0 refills | Status: DC | PRN
Start: 2020-03-23 — End: 2022-05-02

## 2020-03-23 MED ORDER — ALBUTEROL SULFATE HFA 108 (90 BASE) MCG/ACT IN AERS
4.0000 | INHALATION_SPRAY | Freq: Once | RESPIRATORY_TRACT | Status: AC
  Administered 2020-03-23: 4 via RESPIRATORY_TRACT
  Filled 2020-03-23: qty 6.7

## 2020-03-23 MED ORDER — METHYLPREDNISOLONE 4 MG PO TBPK: ORAL_TABLET | ORAL | 0 refills | Status: DC

## 2020-03-23 MED ORDER — FAMOTIDINE 20 MG PO TABS: 20.0000 mg | ORAL_TABLET | Freq: Two times a day (BID) | ORAL | 0 refills | Status: DC

## 2020-03-23 MED ORDER — DIPHENHYDRAMINE HCL 25 MG PO CAPS
25.0000 mg | ORAL_CAPSULE | Freq: Once | ORAL | Status: AC
  Administered 2020-03-23: 25 mg via ORAL

## 2020-03-23 MED ORDER — DIPHENHYDRAMINE HCL 25 MG PO CAPS
25.0000 mg | ORAL_CAPSULE | Freq: Once | ORAL | Status: DC
  Filled 2020-03-23 (×2): qty 1

## 2020-03-23 NOTE — ED Provider Notes (Signed)
[facility] EMERGENCY DEPARTMENT Provider Note   CSN: [id number] Arrival date & time: 03/23/20  0343     History Chief Complaint  Patient presents with  . Cough    Rash   . Rash    Kaylee Cowan is a 40 y.o. female w/ pmh of Asthma, migraine who for presents for evaluation of Hives. She had onset of Hives yesterday on her hands, wrists, shoulders. She had no obvious inciting cause. She has had worsening asthma sxs and has been using her inhaler up to 4 times daily for the past month. She has no facial, hypoglossal swelling, or throat swelling She denies wheezing and hives have resolved except for a small circle on her hand that has returned.   HPI     Past Medical History:  Diagnosis Date  . Anemia   . Asthma   . Giant cell tumor   . Hypersomnolence   . Migraines   . Sleep disorder     Patient Active Problem List   Diagnosis Date Noted  . Presence of intrauterine contraceptive device (IUD) 11/28/2019  . Encounter for administration of vaccine 11/28/2019  . Low back pain 11/20/2019  . Mild obstructive sleep apnea 11/08/2019  . Excessive daytime sleepiness 11/08/2019  . Encounter for removal and reinsertion of intrauterine contraceptive device (IUD) 10/18/2019  . Women's annual routine gynecological examination 10/18/2019  . Sleep disorder   . Hypersomnolence   . Asthma   . Giant cell tumor   . Lower extremity edema 08/21/2019  . Peripheral vasoconstriction ([place]) 08/21/2019  . 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 Migraine 08/21/2019    Past Surgical History:  Procedure Laterality Date  . MASS EXCISION Right 05/16/2019   Procedure: Excision right wrist volar mass;  Surgeon: Iran Planas, MD;  Location: [facility];  Service: Orthopedics;  Laterality: Right;  with IV sedation  . NASAL SEPTUM SURGERY    . REFRACTIVE SURGERY    . ROTATOR CUFF REPAIR    . TONSILLECTOMY       OB History    Gravida  0   Para  0    Term  0   Preterm  0   AB  0   Living  0     SAB  0   IAB  0   Ectopic  0   Multiple  0   Live Births  0           Family History  Problem Relation Age of Onset  . Diabetes Mother   . Hypertension Father   . Hyperlipidemia Father   . Early death Maternal Grandmother   . Early death Maternal Grandfather   . Heart attack Paternal Grandmother     Social History   Tobacco Use  . Smoking status: Never Smoker  . Smokeless tobacco: Never Used  Vaping Use  . Vaping Use: Never used  Substance Use Topics  . Alcohol use: Not Currently  . Drug use: Never    Home Medications Prior to Admission medications   Medication Sig Start Date End Date Taking? Authorizing Provider  albuterol (VENTOLIN HFA) 108 (90 Base) MCG/ACT inhaler Inhale 1-2 puffs into the lungs every 6 (six) hours as needed for wheezing or shortness of breath. 02/29/20   Jaynee Eagles, PA-C  benzonatate (TESSALON) 100 MG capsule Take 1-2 capsules (100-200 mg total) by mouth 3 (three) times daily as needed. 02/29/20   Jaynee Eagles, PA-C  cetirizine (ZYRTEC ALLERGY) 10 MG tablet  Take 1 tablet (10 mg total) by mouth daily. 02/29/20   Jaynee Eagles, PA-C  Ferrous Fumarate (IRON) 18 MG TBCR Take by mouth.    Joline Salt, RN  levonorgestrel (LILETTA, 52 MG,) 19.5 MCG/DAY IUD IUD 1 each by Intrauterine route once.    [provider]  magnesium 30 MG tablet Take 30 mg by mouth daily.    [provider]  Multiple Vitamin (MULTIVITAMIN ADULT) TABS Take by mouth.    Joline Salt, RN  promethazine-dextromethorphan (PROMETHAZINE-DM) 6.25-15 MG/5ML syrup Take 5 mLs by mouth at bedtime as needed for cough. 02/29/20   Jaynee Eagles, PA-C  topiramate (TOPAMAX) 50 MG tablet Take 1 tablet (50 mg total) by mouth at bedtime. 11/14/19   Dohmeier, Asencion Partridge, MD  VITAMIN D PO Take 500 Units by mouth daily.    [provider]    Allergies    Bee venom  Review of Systems   Review of Systems Ten systems  reviewed and are negative for acute change, except as noted in the HPI.   Physical Exam Updated Vital Signs BP 115/77 (BP Location: Left Arm)   Pulse 86   Temp 98.9 F (37.2 C) (Oral)   Resp 17   SpO2 100%   Physical Exam Vitals and nursing note reviewed.  Constitutional:      General: She is not in acute distress.    Appearance: She is well-developed and well-nourished. She is not diaphoretic.  HENT:     Head: Normocephalic and atraumatic.  Eyes:     General: No scleral icterus.    Conjunctiva/sclera: Conjunctivae normal.  Cardiovascular:     Rate and Rhythm: Normal rate and regular rhythm.     Heart sounds: Normal heart sounds. No murmur heard. No friction rub. No gallop.   Pulmonary:     Effort: Pulmonary effort is normal. No respiratory distress.     Breath sounds: Wheezing present.     Comments: Persistent coughing  Abdominal:     General: Bowel sounds are normal. There is no distension.     Palpations: Abdomen is soft. There is no mass.     Tenderness: There is no abdominal tenderness. There is no guarding.  Musculoskeletal:     Cervical back: Normal range of motion.  Skin:    General: Skin is warm and dry.     Comments: Few small hives R hand, left axilla  Neurological:     Mental Status: She is alert and oriented to person, place, and time.  Psychiatric:        Behavior: Behavior normal.     ED Results / Procedures / Treatments   Labs (all labs ordered are listed, but only abnormal results are displayed) Labs Reviewed  RESP PANEL BY RT-PCR (FLU A&B, COVID) ARPGX2    EKG None  Radiology No results found.  Procedures Procedures (including critical care time)  Medications Ordered in ED Medications  diphenhydrAMINE (BENADRYL) capsule 25 mg (25 mg Oral Not Given 03/23/20 0359)  diphenhydrAMINE (BENADRYL) capsule 25 mg (25 mg Oral Given 03/23/20 0402)    ED Course  I have reviewed the triage vital signs and the nursing notes.  Pertinent labs &  imaging results that were available during my care of the patient were reviewed by me and considered in my medical decision making (see chart for details).    MDM Rules/Calculators/A&P  40 year old female here with mild persistent asthma symptoms.  I ordered and reviewed images of a 2 view chest x-ray which showed no acute abnormalities.  Patient given inhaler here with improvement.  She also has idiopathic hives.  The patient does not have any evidence of anaphylaxis.  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  The patient has no evidence of anaphylaxis, airway compromise.  Her wheezing does not appear to be involved with the hive formation.  She is having coughing which is producing the wheezing.  This was relieved with albuterol inhaler.  Patient will be discharged with meds listed in the AVS list.  She appears otherwise appropriate for discharge and is hemodynamically stable with negative Covid test.  Kaylee Cowan was evaluated in Emergency Department on 03/23/2020 for the symptoms described in the history of present illness. She was evaluated in the context of the global COVID-19 pandemic, which necessitated consideration that the patient might be at risk for infection with the SARS-CoV-2 virus that causes COVID-19. Institutional protocols and algorithms that pertain to the evaluation of patients at risk for COVID-19 are in a state of rapid change based on information released by regulatory bodies including the CDC and federal and state organizations. These policies and algorithms were followed during the patient's care in the ED.  Final Clinical Impression(s) / ED Diagnoses Final diagnoses:  None    Rx / DC Orders ED Discharge Orders    None       Margarita Mail, PA-C 03/23/20 [place], Ankit, MD 03/24/20 2123

## 2020-03-23 NOTE — Discharge Instructions (Signed)
Contact a health care provider if: Your symptoms are not controlled with medicine. Your joints are painful or swollen. Get help right away if: You have a fever. You have pain in your abdomen. Your tongue or lips are swollen. Your eyelids are swollen. Your chest or throat feels tight. You have trouble breathing or swallowing.  Contact a health care provider if: You have muscle aches. You have chest pain. The mucus that you cough up (sputum) changes from clear or white to yellow, green, gray, or bloody. You have a fever. Your sputum gets thicker. Get help right away if: Your wheezing and coughing get worse, even after you take your prescribed medicines. It gets even harder to breathe. You develop severe chest pain.

## 2020-03-23 NOTE — ED Triage Notes (Signed)
Pt reports cough x1 month. Pt reports she took a covid test last week and it was negative. Pt reports she developed a hives a couple hours ago. Pt airway intact.

## 2020-05-20 ENCOUNTER — Encounter: Payer: Self-pay | Admitting: Family Medicine

## 2020-06-13 ENCOUNTER — Ambulatory Visit (HOSPITAL_COMMUNITY)
Admission: EM | Admit: 2020-06-13 | Discharge: 2020-06-13 | Disposition: A | Discharge status: Home / Self Care | Payer: 59

## 2020-06-13 ENCOUNTER — Emergency Department (HOSPITAL_BASED_OUTPATIENT_CLINIC_OR_DEPARTMENT_OTHER)
Admission: EM | Admit: 2020-06-13 | Discharge: 2020-06-13 | Disposition: A | Discharge status: Home / Self Care | Payer: 59 | Attending: Emergency Medicine | Admitting: Emergency Medicine

## 2020-06-13 ENCOUNTER — Encounter (HOSPITAL_BASED_OUTPATIENT_CLINIC_OR_DEPARTMENT_OTHER): Payer: Self-pay

## 2020-06-13 ENCOUNTER — Other Ambulatory Visit: Payer: Self-pay

## 2020-06-13 ENCOUNTER — Encounter (HOSPITAL_COMMUNITY): Payer: Self-pay | Admitting: Emergency Medicine

## 2020-06-13 DIAGNOSIS — R002 Palpitations: Secondary | ICD-10-CM | POA: Diagnosis present

## 2020-06-13 DIAGNOSIS — J45909 Unspecified asthma, uncomplicated: Secondary | ICD-10-CM | POA: Insufficient documentation

## 2020-06-13 LAB — BASIC METABOLIC PANEL
Anion gap: 10 (ref 5–15)
BUN: 17 mg/dL (ref 6–20)
CO2: 23 mmol/L (ref 22–32)
Calcium: 9.3 mg/dL (ref 8.9–10.3)
Chloride: 105 mmol/L (ref 98–111)
Creatinine, Ser: 0.7 mg/dL (ref 0.44–1.00)
GFR, Estimated: >60 mL/min (ref >60)
Glucose, Bld: 99 mg/dL (ref 70–99)
Potassium: 3.8 mmol/L (ref 3.5–5.1)
Sodium: 138 mmol/L (ref 135–145)

## 2020-06-13 LAB — CBC WITH DIFFERENTIAL/PLATELET
Abs Immature Granulocytes: 0.02 10*3/uL (ref 0.00–0.07)
Basophils Absolute: 0.1 10*3/uL (ref 0.0–0.1)
Basophils Relative: 1 %
Eosinophils Absolute: 0.2 10*3/uL (ref 0.0–0.5)
Eosinophils Relative: 2 %
HCT: 38.7 % (ref 36.0–46.0)
Hemoglobin: 12.5 g/dL (ref 12.0–15.0)
Immature Granulocytes: 0 %
Lymphocytes Relative: 37 %
Lymphs Abs: 3.6 10*3/uL (ref 0.7–4.0)
MCH: 27.2 pg (ref 26.0–34.0)
MCHC: 32.3 g/dL (ref 30.0–36.0)
MCV: 84.1 fL (ref 80.0–100.0)
Monocytes Absolute: 0.7 10*3/uL (ref 0.1–1.0)
Monocytes Relative: 7 %
Neutro Abs: 5.2 10*3/uL (ref 1.7–7.7)
Neutrophils Relative %: 53 %
Platelets: 410 10*3/uL — ABNORMAL HIGH (ref 150–400)
RBC: 4.6 MIL/uL (ref 3.87–5.11)
RDW: 14.1 % (ref 11.5–15.5)
WBC: 9.7 10*3/uL (ref 4.0–10.5)
nRBC: 0 % (ref 0.0–0.2)

## 2020-06-13 LAB — MAGNESIUM: Magnesium: 1.8 mg/dL (ref 1.7–2.4)

## 2020-06-13 NOTE — Discharge Instructions (Addendum)
Recommend following up with both cardiology and your primary care doctor.  Return to ER if you develop worsening palpitations, chest pain, difficulty breathing or other new concerning symptoms.

## 2020-06-13 NOTE — ED Provider Notes (Signed)
MSE was initiated and I personally evaluated the patient and placed orders (if any) at  8:23 PM on June 13, 2020.  The patient appears stable so that the remainder of the MSE may be completed by another provider.   Lucrezia Starch, MD 06/13/20 2023

## 2020-06-13 NOTE — ED Triage Notes (Addendum)
Pt presents with heart fluttering and PVC xs 2 weeks. States this usually happens with magnesium, potassium, and/ or ferratin are low. Pt denies chest pain but does state she has SOB on occasion. Pt advised per Marquis Lunch, PA she needs to go to ED for evaluation as UC does not have the resources to help her.   Patient is being discharged from the Urgent Care and sent to the Emergency Department via POV. Per Marquis Lunch, PA, patient is in need of higher level of care due to heart fluttering and PVC. Patient is aware and verbalizes understanding of plan of care.  Vitals:   06/13/20 1901  BP: (!) 142/98  Pulse: 97  Resp: 17  Temp: 98.7 F (37.1 C)  SpO2: 100%

## 2020-06-13 NOTE — ED Provider Notes (Signed)
[facility] EMERGENCY DEPT Provider Note   CSN: [id number] Arrival date & time: 06/13/20  1949     History Chief Complaint  Patient presents with  . Palpitations    Kaylee Cowan is a 40 y.o. female.  Reports that she has been having palpitations increased over the past couple weeks.  States that she is struggled with palpitations and heart fluttering sensation for a very long time and has had PVCs previously.  States normally this occurs when some of her electrolytes are abnormal.  She denies any associated chest pain or difficulty in breathing.  States that she was seen previously by cardiology.  Is not on any medication for this.  Does not have any symptoms at present.  Episodes last for a few seconds at a time and then resolved.  Not associated with exertion.  Occur at rest.  HPI     Past Medical History:  Diagnosis Date  . Anemia   . Asthma   . Giant cell tumor   . Hypersomnolence   . Migraines   . Sleep disorder     Patient Active Problem List   Diagnosis Date Noted  . Presence of intrauterine contraceptive device (IUD) 11/28/2019  . Encounter for administration of vaccine 11/28/2019  . Low back pain 11/20/2019  . Mild obstructive sleep apnea 11/08/2019  . Excessive daytime sleepiness 11/08/2019  . Encounter for removal and reinsertion of intrauterine contraceptive device (IUD) 10/18/2019  . Women's annual routine gynecological examination 10/18/2019  . Sleep disorder   . Hypersomnolence   . Asthma   . Giant cell tumor   . Lower extremity edema 08/21/2019  . Peripheral vasoconstriction ([place]) 08/21/2019  . 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 Migraine 08/21/2019    Past Surgical History:  Procedure Laterality Date  . MASS EXCISION Right 05/16/2019   Procedure: Excision right wrist volar mass;  Surgeon: Iran Planas, MD;  Location: [facility];  Service: Orthopedics;  Laterality: Right;  with IV sedation  . NASAL SEPTUM  SURGERY    . REFRACTIVE SURGERY    . ROTATOR CUFF REPAIR    . TONSILLECTOMY       OB History    Gravida  0   Para  0   Term  0   Preterm  0   AB  0   Living  0     SAB  0   IAB  0   Ectopic  0   Multiple  0   Live Births  0           Family History  Problem Relation Age of Onset  . Diabetes Mother   . Hypertension Father   . Hyperlipidemia Father   . Early death Maternal Grandmother   . Early death Maternal Grandfather   . Heart attack Paternal Grandmother     Social History   Tobacco Use  . Smoking status: Never Smoker  . Smokeless tobacco: Never Used  Vaping Use  . Vaping Use: Never used  Substance Use Topics  . Alcohol use: Not Currently  . Drug use: Never    Home Medications Prior to Admission medications   Medication Sig Start Date End Date Taking? Authorizing Provider  albuterol (VENTOLIN HFA) 108 (90 Base) MCG/ACT inhaler Inhale 1-2 puffs into the lungs every 6 (six) hours as needed for wheezing or shortness of breath. 02/29/20   Jaynee Eagles, PA-C  albuterol (VENTOLIN HFA) 108 (90 Base) MCG/ACT inhaler Inhale 1-2 puffs into the lungs  every 4 (four) hours as needed for wheezing or shortness of breath. 03/23/20   Harris, Abigail, PA-C  benzonatate (TESSALON) 100 MG capsule Take 1-2 capsules (100-200 mg total) by mouth 3 (three) times daily as needed. 02/29/20   Jaynee Eagles, PA-C  cetirizine (ZYRTEC ALLERGY) 10 MG tablet Take 1 tablet (10 mg total) by mouth daily. 02/29/20   Jaynee Eagles, PA-C  EPINEPHrine (EPIPEN 2-PAK) 0.3 mg/0.3 mL IJ SOAJ injection Inject 0.3 mg into the muscle once as needed for up to 1 dose (for severe allergic reaction). CAll 911 immediately if you have to use this medicine 03/23/20   Margarita Mail, PA-C  famotidine (PEPCID) 20 MG tablet Take 1 tablet (20 mg total) by mouth 2 (two) times daily. 03/23/20   Margarita Mail, PA-C  Ferrous Fumarate (IRON) 18 MG TBCR Take by mouth.    Joline Salt, RN  levonorgestrel (LILETTA, 52  MG,) 19.5 MCG/DAY IUD IUD 1 each by Intrauterine route once.    [provider]  magnesium 30 MG tablet Take 30 mg by mouth daily.    [provider]  methylPREDNISolone (MEDROL DOSEPAK) 4 MG TBPK tablet Use as directed 03/23/20   Margarita Mail, PA-C  Multiple Vitamin (MULTIVITAMIN ADULT) TABS Take by mouth.    Joline Salt, RN  promethazine-dextromethorphan (PROMETHAZINE-DM) 6.25-15 MG/5ML syrup Take 5 mLs by mouth at bedtime as needed for cough. 02/29/20   Jaynee Eagles, PA-C  topiramate (TOPAMAX) 50 MG tablet Take 1 tablet (50 mg total) by mouth at bedtime. 11/14/19   Dohmeier, Asencion Partridge, MD  VITAMIN D PO Take 500 Units by mouth daily.    [provider]    Allergies    Bee venom  Review of Systems   Review of Systems  Constitutional: Negative for chills and fever.  HENT: Negative for ear pain and sore throat.   Eyes: Negative for pain and visual disturbance.  Respiratory: Negative for cough and shortness of breath.   Cardiovascular: Positive for palpitations. Negative for chest pain.  Gastrointestinal: Negative for abdominal pain and vomiting.  Genitourinary: Negative for dysuria and hematuria.  Musculoskeletal: Negative for arthralgias and back pain.  Skin: Negative for color change and rash.  Neurological: Negative for seizures and syncope.  All other systems reviewed and are negative.   Physical Exam Updated Vital Signs BP 135/90   Pulse 83   Temp 98.8 F (37.1 C) (Oral)   Resp (!) 22   Ht 5\' 6"  (1.676 m)   Wt 103.4 kg   SpO2 100%   BMI 36.80 kg/m   Physical Exam Vitals and nursing note reviewed.  Constitutional:      General: She is not in acute distress.    Appearance: She is well-developed.  HENT:     Head: Normocephalic and atraumatic.  Eyes:     Conjunctiva/sclera: Conjunctivae normal.  Cardiovascular:     Rate and Rhythm: Normal rate and regular rhythm.     Heart sounds: No murmur heard.   Pulmonary:     Effort: Pulmonary  effort is normal. No respiratory distress.     Breath sounds: Normal breath sounds.  Abdominal:     Palpations: Abdomen is soft.     Tenderness: There is no abdominal tenderness.  Musculoskeletal:     Cervical back: Neck supple.  Skin:    General: Skin is warm and dry.  Neurological:     General: No focal deficit present.     Mental Status: She is alert.  Psychiatric:  Mood and Affect: Mood normal.        Behavior: Behavior normal.     ED Results / Procedures / Treatments   Labs (all labs ordered are listed, but only abnormal results are displayed) Labs Reviewed  CBC WITH DIFFERENTIAL/PLATELET - Abnormal; Notable for the following components:      Result Value   Platelets 410 (*)    All other components within normal limits  BASIC METABOLIC PANEL  MAGNESIUM    EKG None  EKG at 8:35 PM shows sinus tachycardia rate 106, normal intervals and normal axis.  No PVCs identified  Radiology No results found.  Procedures Procedures   Medications Ordered in ED Medications - No data to display  ED Course  I have reviewed the triage vital signs and the nursing notes.  Pertinent labs & imaging results that were available during my care of the patient were reviewed by me and considered in my medical decision making (see chart for details).    MDM Rules/Calculators/A&P                         40 year old lady presents to ER with concern for intermittent episodes of palpitations.  No chest pain or difficulty in breathing.  On exam patient noted to be well-appearing in no distress.  EKG appeared normal, no arrhythmias or abnormal intervals.  Patient was placed on a cardiac monitor and noted very occasional PVC.  Blood count stable, electrolytes stable.  No ongoing symptoms at present.  Recommend she follow-up with her cardiologist and her primary doctor.  Believe she would likely benefit from a Holter monitor.  Given her reassuring appearance and work-up today believe she can  be managed in the outpatient setting.  Discharge home.  After the discussed management above, the patient was determined to be safe for discharge.  The patient was in agreement with this plan and all questions regarding their care were answered.  ED return precautions were discussed and the patient will return to the ED with any significant worsening of condition.    Final Clinical Impression(s) / ED Diagnoses Final diagnoses:  Palpitations    Rx / DC Orders ED Discharge Orders    None       Lucrezia Starch, MD 06/14/20 1335

## 2020-06-13 NOTE — ED Triage Notes (Signed)
Pt came in  C/o heart flutter and palpitations x 2 wks     Pt states  It happens  When she has low magnesium - potassium and iron  - also  C/o SOB but no signs og distress at this time

## 2020-07-04 ENCOUNTER — Encounter: Payer: 59 | Admitting: Family Medicine

## 2020-07-07 ENCOUNTER — Other Ambulatory Visit: Payer: Self-pay

## 2020-07-07 ENCOUNTER — Ambulatory Visit (INDEPENDENT_AMBULATORY_CARE_PROVIDER_SITE_OTHER): Payer: 59 | Admitting: Family Medicine

## 2020-07-07 ENCOUNTER — Encounter: Payer: Self-pay | Admitting: Family Medicine

## 2020-07-07 VITALS — BP 138/100 | HR 86 | Temp 98.0°F | Ht 65.0 in | Wt 235.4 lb

## 2020-07-07 DIAGNOSIS — Z0000 Encounter for general adult medical examination without abnormal findings: Secondary | ICD-10-CM

## 2020-07-07 DIAGNOSIS — R002 Palpitations: Secondary | ICD-10-CM

## 2020-07-07 DIAGNOSIS — I10 Essential (primary) hypertension: Secondary | ICD-10-CM

## 2020-07-07 DIAGNOSIS — E559 Vitamin D deficiency, unspecified: Secondary | ICD-10-CM

## 2020-07-07 DIAGNOSIS — Z8639 Personal history of other endocrine, nutritional and metabolic disease: Secondary | ICD-10-CM | POA: Diagnosis not present

## 2020-07-07 LAB — T3, FREE: T3, Free: 3.5 pg/mL (ref 2.3–4.2)

## 2020-07-07 LAB — FERRITIN: Ferritin: 45.4 ng/mL (ref 10.0–291.0)

## 2020-07-07 LAB — TSH: TSH: 1.25 u[IU]/mL (ref 0.35–4.50)

## 2020-07-07 LAB — T4, FREE: Free T4: 0.73 ng/dL (ref 0.60–1.60)

## 2020-07-07 LAB — VITAMIN D 25 HYDROXY (VIT D DEFICIENCY, FRACTURES): VITD: 19.91 ng/mL — ABNORMAL LOW (ref 30.00–100.00)

## 2020-07-07 MED ORDER — LOSARTAN POTASSIUM 25 MG PO TABS: 25.0000 mg | ORAL_TABLET | Freq: Every day | ORAL | 1 refills | Status: DC

## 2020-07-07 NOTE — Addendum Note (Signed)
Addended by: Raesean Bartoletti L on: 07/07/2020 11:42 AM   Modules accepted: Orders  

## 2020-07-07 NOTE — Patient Instructions (Addendum)
Call Dr. Brett Fairy for appointment.   Goal for blood pressure is around 110-120/70-85. You can check with large automatic cuff at home - and track. Bring cuff and numbers with you to next visit. Don't check more than twice daily.

## 2020-07-07 NOTE — Progress Notes (Signed)
Nhu Derica Leiber DOB: Nov 05, 1980 Encounter date: 07/07/2020  This is a 40 y.o. female who presents for complete physical   History of present illness/Additional concerns: In ER 4/1 with palpitations. PVC noted on monitor but EKG stable. Commented she would benefit from holter monitor. Bmp, cbc, mg normal. She has been taking iron and feels like it has improved. She is concerned for lower iron - this has triggered palpitations for her in the past. She was noting them for 2 weeks before ER, but day she went in they were happening back to back. No other changes that she thought were triggered. Not as frequent. Still happening daily; just not lasting all day - 2-3 times/day. Lasts a couple of seconds when she feels it. Makes her catch breath when she gets it.   She does think she had covid in December - using proventil inhaler now (yellow) - confirmed picture with her; but she was told that this was steroid inhaler.   Last visit with me was 12/2019  Sees Dr. Elgie Congo for gyn care.   See ROS: gets shock like sensation with neck flexion. Had MRI Brain back in 09/2019 and this did show:  (she was told that sensation was secondary to migraine). IMPRESSION: No evidence of acute intracranial abnormality, including acute infarction.  There are few small nonspecific foci of T2 hyperintensity within the cerebral white matter measuring up to 5 mm. Potential etiologies include age-advanced chronic small vessel ischemic changes, sequela of a prior infectious/inflammatory process, migraine headaches or sequela of a demyelinating process such as multiple sclerosis, among others. These foci do not show enhancement.  Minimal ethmoid sinus mucosal thickening.  Thinks weight is result of stress eating - not doing what she should. Has signed up with a trainer. Dad passed in December. She also got married since last visit. So has also had a lot of stress since last visit although states that nothing is  different in last month. She also closed on house day after her dad passed.    Past Medical History:  Diagnosis Date  . Anemia   . Asthma   . Giant cell tumor   . Hypersomnolence   . Migraines   . Sleep disorder    Past Surgical History:  Procedure Laterality Date  . MASS EXCISION Right 05/16/2019   Procedure: Excision right wrist volar mass;  Surgeon: Iran Planas, MD;  Location: [facility];  Service: Orthopedics;  Laterality: Right;  with IV sedation  . NASAL SEPTUM SURGERY    . REFRACTIVE SURGERY    . ROTATOR CUFF REPAIR    . TONSILLECTOMY     Allergies  Allergen Reactions  . Bee Venom Anaphylaxis   Current Meds  Medication Sig  . albuterol (VENTOLIN HFA) 108 (90 Base) MCG/ACT inhaler Inhale 1-2 puffs into the lungs every 4 (four) hours as needed for wheezing or shortness of breath.  . EPINEPHrine (EPIPEN 2-PAK) 0.3 mg/0.3 mL IJ SOAJ injection Inject 0.3 mg into the muscle once as needed for up to 1 dose (for severe allergic reaction). CAll 911 immediately if you have to use this medicine  . Ferrous Fumarate (IRON) 18 MG TBCR Take by mouth.  . levonorgestrel (LILETTA, 52 MG,) 19.5 MCG/DAY IUD IUD 1 each by Intrauterine route once.  Marland Kitchen losartan (COZAAR) 25 MG tablet Take 1 tablet (25 mg total) by mouth daily.  . magnesium 30 MG tablet Take 30 mg by mouth daily.  . Multiple Vitamin (MULTIVITAMIN ADULT) TABS Take by  mouth.  Marland Kitchen VITAMIN D PO Take 500 Units by mouth daily.  . [DISCONTINUED] albuterol (VENTOLIN HFA) 108 (90 Base) MCG/ACT inhaler Inhale 1-2 puffs into the lungs every 6 (six) hours as needed for wheezing or shortness of breath.   Social History   Tobacco Use  . Smoking status: Never Smoker  . Smokeless tobacco: Never Used  Substance Use Topics  . Alcohol use: Not Currently   Family History  Problem Relation Age of Onset  . Diabetes Mother   . Hypertension Father   . Hyperlipidemia Father   . Aortic dissection Father 71  . Early death Maternal  Grandmother   . Aortic dissection Maternal Grandmother 59  . Early death Maternal Grandfather   . Heart attack Paternal Grandmother   . Lupus Sister   . Eczema Niece      Review of Systems  Constitutional: Negative for activity change, appetite change, chills, fatigue, fever and unexpected weight change.  HENT: Negative for congestion, ear pain, hearing loss, sinus pressure, sinus pain, sore throat and trouble swallowing.   Eyes: Negative for pain and visual disturbance.  Respiratory: Negative for cough, chest tightness, shortness of breath and wheezing.   Cardiovascular: Negative for chest pain, palpitations and leg swelling.  Gastrointestinal: Negative for abdominal pain, blood in stool, constipation, diarrhea, nausea and vomiting.  Genitourinary: Negative for difficulty urinating and menstrual problem.  Musculoskeletal: Negative for arthralgias and back pain.  Skin: Negative for rash.  Neurological: Negative for dizziness, weakness, numbness and headaches.       Gets shock sensation down limbs/body with flexion of neck; reproducible. Used to be intermittent; now reliable. Keeps numbness in hands/legs. Almost feels like hands/legs are falling asleep.  Hematological: Negative for adenopathy. Does not bruise/bleed easily.  Psychiatric/Behavioral: Negative for sleep disturbance and suicidal ideas. The patient is not nervous/anxious.     CBC:  Lab Results  Component Value Date   WBC 9.7 06/13/2020   HGB 12.5 06/13/2020   HCT 38.7 06/13/2020   MCH 27.2 06/13/2020   MCHC 32.3 06/13/2020   RDW 14.1 06/13/2020   PLT 410 (H) 06/13/2020   MPV 9.6 09/25/2019   CMP: Lab Results  Component Value Date   NA 138 06/13/2020   K 3.8 06/13/2020   CL 105 06/13/2020   CO2 23 06/13/2020   ANIONGAP 10 06/13/2020   GLUCOSE 99 06/13/2020   BUN 17 06/13/2020   CREATININE 0.70 06/13/2020   CREATININE 0.73 09/25/2019   GFRAA >60 09/14/2019   CALCIUM 9.3 06/13/2020   PROT 7.0 09/25/2019    BILITOT 0.6 09/25/2019   ALKPHOS 76 09/14/2019   ALT 15 09/25/2019   AST 16 09/25/2019   LIPID: Lab Results  Component Value Date   CHOL 184 09/25/2019   TRIG 51 09/25/2019   HDL 58 09/25/2019   LDLCALC 113 (H) 09/25/2019    Objective:  BP (!) 138/100   Pulse 86   Temp 98 F (36.7 C) (Oral)   Ht 5\' 5"  (1.651 m)   Wt 235 lb 6.4 oz (106.8 kg)   SpO2 98%   BMI 39.17 kg/m   Weight: 235 lb 6.4 oz (106.8 kg)   BP Readings from Last 3 Encounters:  07/07/20 (!) 138/100  06/13/20 135/90  06/13/20 (!) 142/98   Wt Readings from Last 3 Encounters:  07/07/20 235 lb 6.4 oz (106.8 kg)  06/13/20 228 lb (103.4 kg)  01/04/20 226 lb 12.8 oz (102.9 kg)    Physical Exam Constitutional:  General: She is not in acute distress.    Appearance: She is well-developed.  HENT:     Head: Normocephalic and atraumatic.     Right Ear: External ear normal.     Left Ear: External ear normal.     Mouth/Throat:     Pharynx: No oropharyngeal exudate.  Eyes:     Conjunctiva/sclera: Conjunctivae normal.     Pupils: Pupils are equal, round, and reactive to light.  Neck:     Thyroid: No thyromegaly.  Cardiovascular:     Rate and Rhythm: Normal rate and regular rhythm.     Heart sounds: Normal heart sounds. No murmur heard. No friction rub. No gallop.   Pulmonary:     Effort: Pulmonary effort is normal.     Breath sounds: Normal breath sounds.  Abdominal:     General: Bowel sounds are normal. There is no distension.     Palpations: Abdomen is soft. There is no mass.     Tenderness: There is no abdominal tenderness. There is no guarding.     Hernia: No hernia is present.  Musculoskeletal:        General: No tenderness or deformity. Normal range of motion.     Cervical back: Normal range of motion and neck supple.  Lymphadenopathy:     Cervical: No cervical adenopathy.  Skin:    General: Skin is warm and dry.     Findings: No rash.  Neurological:     Mental Status: She is alert and  oriented to person, place, and time.     Deep Tendon Reflexes: Reflexes normal.     Reflex Scores:      Tricep reflexes are 2+ on the right side and 2+ on the left side.      Bicep reflexes are 2+ on the right side and 2+ on the left side.      Brachioradialis reflexes are 2+ on the right side and 2+ on the left side.      Patellar reflexes are 2+ on the right side and 2+ on the left side. Psychiatric:        Speech: Speech normal.        Behavior: Behavior normal.        Thought Content: Thought content normal.     Assessment/Plan: There are no preventive care reminders to display for this patient. Health Maintenance reviewed. She is planning on starting back with exercise and healthy eating.   1. Preventative health care Work on regular exercise and healthier eating.   2. Palpitations We are going to start with lab work then consider additional evaluation/Holter pending these results.  - TSH; Future - T4, free; Future - T3, free; Future  3. History of iron deficiency She has already started replacing iron and is feeling better. We will see where stores are.  - Ferritin; Future - Iron and TIBC; Future  4. Vitamin D deficiency She has been doing replacement.  - VITAMIN D 25 Hydroxy (Vit-D Deficiency, Fractures); Future  5. Hypertension, unspecified type Starting with losartan. Discussed new medication(s) today with patient. Discussed potential side effects and patient verbalized understanding.  - losartan (COZAAR) 25 MG tablet; Take 1 tablet (25 mg total) by mouth daily.  Dispense: 90 tablet; Refill: 1  Return in about 2 months (around 09/06/2020) for Chronic condition visit.  Micheline Rough, MD

## 2020-07-08 LAB — IRON AND TIBC
Iron Saturation: 12 % — ABNORMAL LOW (ref 15–55)
Iron: 35 ug/dL (ref 27–159)
Total Iron Binding Capacity: 286 ug/dL (ref 250–450)
UIBC: 251 ug/dL (ref 131–425)

## 2020-07-09 ENCOUNTER — Encounter: Payer: Self-pay | Admitting: Family Medicine

## 2020-07-10 ENCOUNTER — Telehealth: Payer: Self-pay | Admitting: Family Medicine

## 2020-07-10 MED ORDER — VITAMIN D (ERGOCALCIFEROL) 1.25 MG (50000 UNIT) PO CAPS: 50000.0000 [IU] | ORAL_CAPSULE | ORAL | 1 refills | Status: DC

## 2020-07-10 MED ORDER — HYDROCHLOROTHIAZIDE 12.5 MG PO TABS: 12.5000 mg | ORAL_TABLET | Freq: Every day | ORAL | 2 refills | Status: DC

## 2020-07-10 NOTE — Telephone Encounter (Signed)
Pt states that Dr. Ethlyn Gallery was supposed to contact her Dr. At her Neurology office to look at the MRI to see if she had a certain diagnosis that her and Dr. Ethlyn Gallery had discussed on Monday.  She said that her Neurologist hasn't heard anything from Dr. Ethlyn Gallery yet.  [facility]

## 2020-07-11 ENCOUNTER — Other Ambulatory Visit: Payer: Self-pay | Admitting: Family Medicine

## 2020-07-11 DIAGNOSIS — R29818 Other symptoms and signs involving the nervous system: Secondary | ICD-10-CM

## 2020-07-11 DIAGNOSIS — R9089 Other abnormal findings on diagnostic imaging of central nervous system: Secondary | ICD-10-CM

## 2020-07-11 NOTE — Telephone Encounter (Signed)
Spoke with the pt and informed her of the messages below.  Patient stated she spoke with the scheduler and was told a new referral is needed due to this being a new problem.  Message sent to PCP.

## 2020-07-11 NOTE — Telephone Encounter (Signed)
Her neurologist will be able to see her scan and my note as well.

## 2020-07-11 NOTE — Telephone Encounter (Signed)
I sent a message to her neurologist as a heads up; but patient needs to call and set up a follow up visit with neurology.

## 2020-07-11 NOTE — Telephone Encounter (Signed)
I didn't think it would need a new referral, but I have gone ahead and placed this.  I think she would be fine to see any provider in this practice which I wrote in the referral, but did comment that she had seen Dr. Brett Fairy for other issues in the past and I think would be reasonable for her to see Dr. Felecia Shelling as well.

## 2020-07-11 NOTE — Telephone Encounter (Signed)
Patient informed of the message below.

## 2020-07-15 ENCOUNTER — Telehealth: Payer: Self-pay

## 2020-07-15 NOTE — Telephone Encounter (Signed)
We received a new referral for a patient of Dr. Brett Fairy, last seen 08/2019 for a sleep consult. The new referral is for an abnormal MRI and Lhermitte's sign positive. The patient is requesting a provider switch from Dr. Brett Fairy to Dr. Felecia Shelling.  Can you please advise?

## 2020-07-15 NOTE — Telephone Encounter (Signed)
Dr Brett Fairy is out of the office on vacation for 2 weeks. It appears that the patient completed a sleep work up through Dr Brett Fairy and after [employer] was completed, pt  was referred to dentist for a dental device to help with treating mild OSA. Pt has not needed Dr. Brett Fairy since, from a sleep standpoint. Dr Brett Fairy would normally be ok with this switch. If Dr Felecia Shelling is ok with this switch and seeing the patient as new MS consult then from sleep clinic standpoint it would be ok to switch as long as advise the patient that upon Dr Dohmeier's return if she disagrees with this decision for whatever reason then we may have to call back and reschedule.

## 2020-07-15 NOTE — Telephone Encounter (Signed)
I am ok seeing her

## 2020-07-28 NOTE — Telephone Encounter (Signed)
Absolutely OK 

## 2020-08-07 ENCOUNTER — Ambulatory Visit (INDEPENDENT_AMBULATORY_CARE_PROVIDER_SITE_OTHER): Payer: 59

## 2020-08-07 ENCOUNTER — Ambulatory Visit (INDEPENDENT_AMBULATORY_CARE_PROVIDER_SITE_OTHER): Payer: 59 | Admitting: Cardiology

## 2020-08-07 ENCOUNTER — Encounter: Payer: Self-pay | Admitting: Cardiology

## 2020-08-07 ENCOUNTER — Other Ambulatory Visit: Payer: Self-pay

## 2020-08-07 VITALS — BP 112/76 | HR 94 | Ht 65.0 in | Wt 231.0 lb

## 2020-08-07 DIAGNOSIS — I499 Cardiac arrhythmia, unspecified: Secondary | ICD-10-CM | POA: Diagnosis not present

## 2020-08-07 DIAGNOSIS — Z6838 Body mass index (BMI) 38.0-38.9, adult: Secondary | ICD-10-CM

## 2020-08-07 NOTE — Patient Instructions (Signed)
Medication Instructions:  Please continue your current medications   *If you need a refill on your cardiac medications before your next appointment, please call your pharmacy*   Lab Work: None  Testing/Procedures: Heart monitor (Zio patch) for 2 weeks (14 days)   Your physician has recommended that you wear a Zio monitor. This monitor is a medical device that records the heart's electrical activity. Doctors most often use these monitors to diagnose arrhythmias. Arrhythmias are problems with the speed or rhythm of the heartbeat. The monitor is a small device applied to your chest. You can wear one while you do your normal daily activities. While wearing this monitor if you have any symptoms to push the button and record what you felt. Once you have worn this monitor for the period of time provider prescribed (Usually 14 days), you will return the monitor device in the postage paid box. Once it is returned they will download the data collected and provide [place] with a report which the provider will then review and we will call you with those results. Important tips:  1. Avoid showering during the first 24 hours of wearing the monitor. 2. Avoid excessive sweating to help maximize wear time. 3. Do not submerge the device, no hot tubs, and no swimming pools. 4. Keep any lotions or oils away from the patch. 5. After 24 hours you may shower with the patch on. Take brief showers with your back facing the shower head.  6. Do not remove patch once it has been placed because that will interrupt data and decrease adhesive wear time. 7. Push the button when you have any symptoms and write down what you were feeling. 8. Once you have completed wearing your monitor, remove and place into box which has postage paid and place in your outgoing mailbox.  9. If for some reason you have misplaced your box then call our office and we can provide another box and/or mail it off for you.  Follow-Up: At [facility],  you and your health needs are our priority.  As part of our continuing mission to provide you with exceptional heart care, we have created designated Provider Care Teams.  These Care Teams include your primary Cardiologist (physician) and Advanced Practice Providers (APPs -  Physician Assistants and Nurse Practitioners) who all work together to provide you with the care you need, when you need it.  Your next appointment:   1 month(s) after your zio wear  The format for your next appointment:   In Person  Provider:   You may see Agbor-Etang or one of the following Advanced Practice Providers on your designated Care Team:    Murray Hodgkins, NP  Christell Faith, PA-C  Marrianne Mood, PA-C  Cadence [place], [place]  Laurann Montana, NP

## 2020-08-07 NOTE — Progress Notes (Signed)
Cardiology Office Note:    Date:  08/07/2020   ID:  Kaylee Cowan, DOB 1980-10-16, MRN [id number]  PCP:  Caren Macadam, MD   [facility] Providers Cardiologist:  None     Referring MD: Caren Macadam, MD   Chief Complaint  Patient presents with  . New Patient (Initial Visit)    Self referral - PVCs. Meds reviewed verbally with patient.     History of Present Illness:    Kaylee Cowan is a 40 y.o. female with a hx of asthma, who presents due to heartbeat irregularity.  Patient states having heart flutters/irregular heartbeats over the past 2 months.  Symptoms are not related with exertion, occur almost every other day.  Symptoms are sometimes associated with shortness of breath.  Recently diagnosed with hypertension, started on HCTZ 12.5 mg daily by primary care physician.  Was seen in the ED due to heart flutters, was told it could be a PVC.  EKG in the ED at the time showed sinus tachycardia.  Past Medical History:  Diagnosis Date  . Anemia   . Asthma   . Giant cell tumor   . Hypersomnolence   . Migraines   . Sleep disorder     Past Surgical History:  Procedure Laterality Date  . MASS EXCISION Right 05/16/2019   Procedure: Excision right wrist volar mass;  Surgeon: Iran Planas, MD;  Location: [facility];  Service: Orthopedics;  Laterality: Right;  with IV sedation  . NASAL SEPTUM SURGERY    . REFRACTIVE SURGERY    . ROTATOR CUFF REPAIR    . TONSILLECTOMY      Current Medications: Current Meds  Medication Sig  . albuterol (VENTOLIN HFA) 108 (90 Base) MCG/ACT inhaler Inhale 1-2 puffs into the lungs every 4 (four) hours as needed for wheezing or shortness of breath.  . EPINEPHrine (EPIPEN 2-PAK) 0.3 mg/0.3 mL IJ SOAJ injection Inject 0.3 mg into the muscle once as needed for up to 1 dose (for severe allergic reaction). CAll 911 immediately if you have to use this medicine  . Ferrous Fumarate (IRON) 18 MG TBCR Take by mouth.   . hydrochlorothiazide (HYDRODIURIL) 12.5 MG tablet Take 1 tablet (12.5 mg total) by mouth daily.  Marland Kitchen levonorgestrel (LILETTA, 52 MG,) 19.5 MCG/DAY IUD IUD 1 each by Intrauterine route once.  . magnesium 30 MG tablet Take 30 mg by mouth daily.  . Multiple Vitamin (MULTIVITAMIN ADULT) TABS Take by mouth.  Marland Kitchen VITAMIN D PO Take 500 Units by mouth daily.  . Vitamin D, Ergocalciferol, (DRISDOL) 1.25 MG (50000 UNIT) CAPS capsule Take 1 capsule (50,000 Units total) by mouth every 7 (seven) days.     Allergies:   Bee venom   Social History   Socioeconomic History  . Marital status: Single    Spouse name: Not on file  . Number of children: Not on file  . Years of education: Not on file  . Highest education level: Not on file  Occupational History  . Not on file  Tobacco Use  . Smoking status: Never Smoker  . Smokeless tobacco: Never Used  Vaping Use  . Vaping Use: Never used  Substance and Sexual Activity  . Alcohol use: Not Currently  . Drug use: Never  . Sexual activity: Not Currently    Birth control/protection: I.U.D.  Other Topics Concern  . Not on file  Social History Narrative  . Not on file   Social Determinants of Health  Financial Resource Strain: Not on file  Food Insecurity: No Food Insecurity  . Worried About Charity fundraiser in the Last Year: Never true  . Ran Out of Food in the Last Year: Never true  Transportation Needs: No Transportation Needs  . Lack of Transportation (Medical): No  . Lack of Transportation (Non-Medical): No  Physical Activity: Not on file  Stress: Not on file  Social Connections: Not on file     Family History: The patient's family history includes Aortic dissection (age of onset: 65) in her father and maternal grandmother; Diabetes in her mother; Early death in her maternal grandfather and maternal grandmother; Eczema in her niece; Heart attack in her paternal grandmother; Hyperlipidemia in her father; Hypertension in her father; Lupus  in her sister.  ROS:   Please see the history of present illness.     All other systems reviewed and are negative.  EKGs/Labs/Other Studies Reviewed:    The following studies were reviewed today:   EKG:  EKG is  ordered today.  The ekg ordered today demonstrates normal sinus rhythm, normal ECG  Recent Labs: 09/25/2019: ALT 15 06/13/2020: BUN 17; Creatinine, Ser 0.70; Hemoglobin 12.5; Magnesium 1.8; Platelets 410; Potassium 3.8; Sodium 138 07/07/2020: TSH 1.25  Recent Lipid Panel    Component Value Date/Time   CHOL 184 09/25/2019 0832   TRIG 51 09/25/2019 0832   HDL 58 09/25/2019 0832   CHOLHDL 3.2 09/25/2019 0832   LDLCALC 113 (H) 09/25/2019 0832     Risk Assessment/Calculations:      Physical Exam:    VS:  BP 112/76 (BP Location: Left Arm, Patient Position: Sitting, Cuff Size: Normal)   Pulse 94   Ht 5\' 5"  (1.651 m)   Wt 231 lb (104.8 kg)   SpO2 98%   BMI 38.44 kg/m     Wt Readings from Last 3 Encounters:  08/07/20 231 lb (104.8 kg)  07/07/20 235 lb 6.4 oz (106.8 kg)  06/13/20 228 lb (103.4 kg)     GEN:  Well nourished, well developed in no acute distress HEENT: Normal NECK: No JVD; No carotid bruits LYMPHATICS: No lymphadenopathy CARDIAC: RRR, no murmurs, rubs, gallops RESPIRATORY:  Clear to auscultation without rales, wheezing or rhonchi  ABDOMEN: Soft, non-tender, non-distended MUSCULOSKELETAL:  No edema; No deformity  SKIN: Warm and dry NEUROLOGIC:  Alert and oriented x 3 PSYCHIATRIC:  Normal affect   ASSESSMENT:    1. Irregular heart beat   2. BMI 38.0-38.9,adult    PLAN:    In order of problems listed above:  1. Irregular heartbeat, heart flutters.  Placed 2-week cardiac monitor to evaluate any significant arrhythmias. 2. Obesity, weight loss recommended.  Follow-up after cardiac monitor.     Medication Adjustments/Labs and Tests Ordered: Current medicines are reviewed at length with the patient today.  Concerns regarding medicines are  outlined above.  Orders Placed This Encounter  Procedures  . LONG TERM MONITOR (3-14 DAYS)  . EKG 12-Lead   No orders of the defined types were placed in this encounter.   Patient Instructions  Medication Instructions:  Please continue your current medications   *If you need a refill on your cardiac medications before your next appointment, please call your pharmacy*   Lab Work: None  Testing/Procedures: Heart monitor (Zio patch) for 2 weeks (14 days)   Your physician has recommended that you wear a Zio monitor. This monitor is a medical device that records the heart's electrical activity. Doctors most often use these monitors  to diagnose arrhythmias. Arrhythmias are problems with the speed or rhythm of the heartbeat. The monitor is a small device applied to your chest. You can wear one while you do your normal daily activities. While wearing this monitor if you have any symptoms to push the button and record what you felt. Once you have worn this monitor for the period of time provider prescribed (Usually 14 days), you will return the monitor device in the postage paid box. Once it is returned they will download the data collected and provide [place] with a report which the provider will then review and we will call you with those results. Important tips:  1. Avoid showering during the first 24 hours of wearing the monitor. 2. Avoid excessive sweating to help maximize wear time. 3. Do not submerge the device, no hot tubs, and no swimming pools. 4. Keep any lotions or oils away from the patch. 5. After 24 hours you may shower with the patch on. Take brief showers with your back facing the shower head.  6. Do not remove patch once it has been placed because that will interrupt data and decrease adhesive wear time. 7. Push the button when you have any symptoms and write down what you were feeling. 8. Once you have completed wearing your monitor, remove and place into box which has postage  paid and place in your outgoing mailbox.  9. If for some reason you have misplaced your box then call our office and we can provide another box and/or mail it off for you.  Follow-Up: At [facility], you and your health needs are our priority.  As part of our continuing mission to provide you with exceptional heart care, we have created designated Provider Care Teams.  These Care Teams include your primary Cardiologist (physician) and Advanced Practice Providers (APPs -  Physician Assistants and Nurse Practitioners) who all work together to provide you with the care you need, when you need it.  Your next appointment:   1 month(s) after your zio wear  The format for your next appointment:   In Person  Provider:   You may see Agbor-Etang or one of the following Advanced Practice Providers on your designated Care Team:    Murray Hodgkins, NP  Christell Faith, PA-C  Marrianne Mood, PA-C  Cadence [place], [place]  Laurann Montana, NP     Signed, Kate Sable, MD  08/07/2020 12:32 PM    [place]

## 2020-08-20 ENCOUNTER — Encounter: Payer: Self-pay | Admitting: Neurology

## 2020-08-20 ENCOUNTER — Ambulatory Visit (INDEPENDENT_AMBULATORY_CARE_PROVIDER_SITE_OTHER): Payer: 59 | Admitting: Neurology

## 2020-08-20 VITALS — BP 110/79 | HR 86 | Ht 65.0 in | Wt 233.5 lb

## 2020-08-20 DIAGNOSIS — N3941 Urge incontinence: Secondary | ICD-10-CM

## 2020-08-20 DIAGNOSIS — R200 Anesthesia of skin: Secondary | ICD-10-CM | POA: Diagnosis not present

## 2020-08-20 DIAGNOSIS — G4719 Other hypersomnia: Secondary | ICD-10-CM

## 2020-08-20 DIAGNOSIS — R29818 Other symptoms and signs involving the nervous system: Secondary | ICD-10-CM | POA: Diagnosis not present

## 2020-08-20 DIAGNOSIS — G43709 Chronic migraine without aura, not intractable, without status migrainosus: Secondary | ICD-10-CM

## 2020-08-20 MED ORDER — TOPIRAMATE 50 MG PO TABS: ORAL_TABLET | ORAL | 11 refills | Status: DC

## 2020-08-20 NOTE — Progress Notes (Signed)
[facility]  PATIENT: Kaylee Cowan DOB: Mar 25, 1980  REFERRING DOCTOR OR PCP:  Micheline Rough, MD SOURCE: patient, notes from Neurology, PCP and ED, imaging and lab reports, MRI images personally reviewed.  _________________________________   HISTORICAL  CHIEF COMPLAINT:  Chief Complaint  Patient presents with  . New Patient (Initial Visit)    RM 13. Internal referral from Dr. Brett Fairy for abnormal MRI/Lhermitte's sign. Has dull pain behind eyes intermittently. No blurriness/double vision. Received Pfizer covid-19 vaccine 05/21/19, 06/12/19, 01/19/20. Pain started after this and she thought it was the cause but sx have been ongoing. Sister dx w/ Lupus in 2020. Has been seen by rheumatology in the past for joint pain/swelling. Found inflammation throughout her body but could not find cause. Started her on gabapentin but unable to tolerate.    HISTORY OF PRESENT ILLNESS:  I had the pleasure of seeing your patient, Kaylee Cowan, at [facility] for neurologic consultation regarding her abnormal brain MRI, Lhermitte sign and concern of multiple sclerosis.  She is a 40 year old woman with a Lhermitte sign that started in 2021 a week or two after the first Covid-19 vaccination [employer]) in March 2021.  She also had pain behind her eyes.    She had similar eye pain after the 2nd shot and after her booster later in 2021.  When she bends her head, she gets a buzzing sensation down her neck. These occur multiple times a day and just lasts a few seconds.   The sensation is buzzing not pain.   She feels it into her arms, trunks and proximal legs.  She went to orthopedics as she also had also had some mid and lower back pain.  She did PT but she continues to experience these symptoms.   Last year, she had an episode of left sided numbness and weakness that lasted a couple hours.     She went to the ED and had an MRI showing 2 T2 hyperintense foci.      She was felt to have a complicated migraine.  Currently she is experiencing the Lhermitte sign most days.   She notes numbness in her hands, right > left, with a sensation it is going to sleep with pins/needles dysesthesia.   Sh often moves her arm to see if it improves but it usually does not.    It does not awaken her at night.  She notes no significant hand weakness and does note mild weakness in her legs at times.   She has some urge incontinence that actually started several years ago.    She was placed on Myrbetriq a few years ago and never had a good explanation for urge incontinence.  She has migraine headaches since age 65, after starting OCP (Yaz).  Topiramate has helped reduce from daily to 3-4 a month.   When one occurs, she takes Excedrin Migraine and Magnesium.  With a migraine she gets photophobia, phonophobia and pain wrosens with movements of the head.   Sh sometimes has nausea but not vomiting.  Pain is often right sided and occasional left sided with a throbbing quality.  She is currently on only 50 mg daily.  She has Vit D deficiency and takes a supplement.   She is on a progesterone IUD and I advised her she should discuss further with hr gynecologist as topiramate may reduce the efficacy.     She has palpitations and currently has a path to analyze.  She has seen rheumatology  for joint inflammation.  Some rheumatology tests were reportedly positive.   She was placed on gabapentin for the pain.   It was poorly tolerated and she stopped.   Labs were with [facility] (Dr. Collene Leyden) and she also saw [facility] (Dr. Ernestina Columbia).  She also saw a sleep doctor there for hypersomnolence.    Vit D was 19.9.   Heavy metal test showed increase nickel ad tin   Aluminum was borderline high.   I personally reviewed the MRI of the brain from 09/14/2019.  It shows 2 T2/FLAIR hyperintense foci in the subcortical white matter of the left frontal lobe.  Neither these appear to be acute.  They do not  enhance.  No periventricular or infratentorial foci were noted.  REVIEW OF SYSTEMS: Constitutional: No fevers, chills, sweats, or change in appetite Eyes: No visual changes, double vision, eye pain Ear, nose and throat: No hearing loss, ear pain, nasal congestion, sore throat Cardiovascular: No chest pain, palpitations Respiratory: No shortness of breath at rest or with exertion.   No wheezes GastrointestinaI: No nausea, vomiting, diarrhea, abdominal pain, fecal incontinence Genitourinary: No dysuria, urinary retention or frequency.  No nocturia. Musculoskeletal: No neck pain, back pain Integumentary: No rash, pruritus, skin lesions Neurological: as above Psychiatric: No depression at this time.  No anxiety Endocrine: No palpitations, diaphoresis, change in appetite, change in weigh or increased thirst Hematologic/Lymphatic: No anemia, purpura, petechiae. Allergic/Immunologic: No itchy/runny eyes, nasal congestion, recent allergic reactions, rashes  ALLERGIES: Allergies  Allergen Reactions  . Bee Venom Anaphylaxis    HOME MEDICATIONS:  Current Outpatient Medications:  .  albuterol (VENTOLIN HFA) 108 (90 Base) MCG/ACT inhaler, Inhale 1-2 puffs into the lungs every 4 (four) hours as needed for wheezing or shortness of breath., Disp: 1 each, Rfl: 0 .  EPINEPHrine (EPIPEN 2-PAK) 0.3 mg/0.3 mL IJ SOAJ injection, Inject 0.3 mg into the muscle once as needed for up to 1 dose (for severe allergic reaction). CAll 911 immediately if you have to use this medicine, Disp: 1 each, Rfl: 1 .  Ferrous Fumarate (IRON) 18 MG TBCR, Take by mouth., Disp: , Rfl:  .  hydrochlorothiazide (HYDRODIURIL) 12.5 MG tablet, Take 1 tablet (12.5 mg total) by mouth daily., Disp: 30 tablet, Rfl: 2 .  levonorgestrel (LILETTA, 52 MG,) 19.5 MCG/DAY IUD IUD, 1 each by Intrauterine route once., Disp: , Rfl:  .  magnesium 30 MG tablet, Take 30 mg by mouth daily., Disp: , Rfl:  .  Multiple Vitamin (MULTIVITAMIN ADULT)  TABS, Take by mouth., Disp: , Rfl:  .  topiramate (TOPAMAX) 50 MG tablet, One or two po qHS, Disp: 60 tablet, Rfl: 11 .  VITAMIN D PO, Take 500 Units by mouth daily., Disp: , Rfl:  .  Vitamin D, Ergocalciferol, (DRISDOL) 1.25 MG (50000 UNIT) CAPS capsule, Take 1 capsule (50,000 Units total) by mouth every 7 (seven) days., Disp: 12 capsule, Rfl: 1  PAST MEDICAL HISTORY: Past Medical History:  Diagnosis Date  . Anemia   . Asthma   . Giant cell tumor   . Hypersomnolence   . Migraines   . Sleep disorder     PAST SURGICAL HISTORY: Past Surgical History:  Procedure Laterality Date  . MASS EXCISION Right 05/16/2019   Procedure: Excision right wrist volar mass;  Surgeon: Iran Planas, MD;  Location: [facility];  Service: Orthopedics;  Laterality: Right;  with IV sedation  . NASAL SEPTUM SURGERY     tissue removed from nose  . REFRACTIVE  SURGERY    . ROTATOR CUFF REPAIR    . TONSILLECTOMY      FAMILY HISTORY: Family History  Problem Relation Age of Onset  . Diabetes Mother   . Hypertension Father   . Hyperlipidemia Father   . Aortic dissection Father 15  . Early death Maternal Grandmother   . Aortic dissection Maternal Grandmother 75  . Early death Maternal Grandfather   . Heart attack Paternal Grandmother   . Lupus Sister   . Eczema Niece     SOCIAL HISTORY:  Social History   Socioeconomic History  . Marital status: Single    Spouse name: Not on file  . Number of children: 0  . Years of education: 69  . Highest education level: Not on file  Occupational History  . Not on file  Tobacco Use  . Smoking status: Never Smoker  . Smokeless tobacco: Never Used  Vaping Use  . Vaping Use: Never used  Substance and Sexual Activity  . Alcohol use: Not Currently  . Drug use: Never  . Sexual activity: Not Currently    Birth control/protection: I.U.D.  Other Topics Concern  . Not on file  Social History Narrative   Right handed   Caffeine use: 3 times per  week (Cowan, no soda, energy drink once per week)   Social Determinants of Health   Financial Resource Strain: Not on file  Food Insecurity: No Food Insecurity  . Worried About Charity fundraiser in the Last Year: Never true  . Ran Out of Food in the Last Year: Never true  Transportation Needs: No Transportation Needs  . Lack of Transportation (Medical): No  . Lack of Transportation (Non-Medical): No  Physical Activity: Not on file  Stress: Not on file  Social Connections: Not on file  Intimate Partner Violence: Not on file     PHYSICAL EXAM  Vitals:   08/20/20 0831  BP: 110/79  Pulse: 86  SpO2: 98%  Weight: 233 lb 8 oz (105.9 kg)  Height: 5\' 5"  (1.651 m)    Body mass index is 38.86 kg/m.   General: The patient is well-developed and well-nourished and in no acute distress  HEENT:  Head is [place]/AT.  Sclera are anicteric.  Funduscopic exam shows normal optic discs and retinal vessels.  Neck: No carotid bruits are noted.  The neck is nontender.  Cardiovascular: The heart has a regular rate and rhythm with a normal S1 and S2. There were no murmurs, gallops or rubs.    Skin: Extremities are without rash or  edema.  Musculoskeletal:  Back is nontender  Neurologic Exam  Mental status: The patient is alert and oriented x 3 at the time of the examination. The patient has apparent normal recent and remote memory, with an apparently normal attention span and concentration ability.   Speech is normal.  Cranial nerves: Extraocular movements are full. Pupils are equal, round, and reactive to light and accomodation.  Visual fields are full.  Facial symmetry is present. There is good facial sensation to soft touch bilaterally.Facial strength is normal.  Trapezius and sternocleidomastoid strength is normal. No dysarthria is noted.  The tongue is midline, and the patient has symmetric elevation of the soft palate. No obvious hearing deficits are noted.  Motor:  Muscle bulk is normal.    Tone is normal. Strength is  5 / 5 in all 4 extremities.   Sensory: Sensory testing is intact to pinprick, soft touch and vibration sensation in all 4  extremities.  Coordination: Cerebellar testing reveals good finger-nose-finger and heel-to-shin bilaterally.  Gait and station: Station is normal.   Gait is normal. Tandem gait is normal. Romberg is negative.   Reflexes: Deep tendon reflexes are symmetric and normal bilaterally.   Plantar responses are flexor.    DIAGNOSTIC DATA (LABS, IMAGING, TESTING) - I reviewed patient records, labs, notes, testing and imaging myself where available.  Lab Results  Component Value Date   WBC 9.7 06/13/2020   HGB 12.5 06/13/2020   HCT 38.7 06/13/2020   MCV 84.1 06/13/2020   PLT 410 (H) 06/13/2020      Component Value Date/Time   NA 138 06/13/2020 2024   K 3.8 06/13/2020 2024   CL 105 06/13/2020 2024   CO2 23 06/13/2020 2024   GLUCOSE 99 06/13/2020 2024   BUN 17 06/13/2020 2024   CREATININE 0.70 06/13/2020 2024   CREATININE 0.73 09/25/2019 0832   CALCIUM 9.3 06/13/2020 2024   PROT 7.0 09/25/2019 0832   ALBUMIN 3.8 09/14/2019 2154   AST 16 09/25/2019 0832   ALT 15 09/25/2019 0832   ALKPHOS 76 09/14/2019 2154   BILITOT 0.6 09/25/2019 0832   GFRNONAA >60 06/13/2020 2024   GFRAA >60 09/14/2019 2154   Lab Results  Component Value Date   CHOL 184 09/25/2019   HDL 58 09/25/2019   LDLCALC 113 (H) 09/25/2019   TRIG 51 09/25/2019   CHOLHDL 3.2 09/25/2019   Lab Results  Component Value Date   HGBA1C 5.2 09/25/2019   Lab Results  Component Value Date   XBDZHGDJ24 268 09/25/2019   Lab Results  Component Value Date   TSH 1.25 07/07/2020       ASSESSMENT AND PLAN  Lhermitte's sign positive - Plan: MR CERVICAL SPINE W WO CONTRAST  Numbness - Plan: MR CERVICAL SPINE W WO CONTRAST  Chronic migraine without aura without status migrainosus, not intractable  Urge incontinence  Excessive daytime sleepiness   In summary, Ms.  Cowan is a 40 year old woman who has had intermittent Lhermitte sign's over the past year.  Additionally she will have some unrelated numbness in her hands and sometimes her legs.  She has had urge incontinence for several years.  Due to the symptoms, I am most concerned about an intrinsic or extrinsic myelopathy and we will check an MRI of the cervical spine to further assess this possibility.  Further evaluation or referral may be necessary depending on the results.  She will continue to take Topamax at night.  We did discuss that it may interact with birth control.  I recommended changing to zonisamide that has a similar action with less interaction.  However, she has had difficulty tolerating some medicine she would prefer to hold off on that move at this time.  Her MRI of the brain did not show to T2 hyperintense foci in the subcortical white matter in the left hemisphere.  This is a nonspecific finding.  It is more likely to be due to either minimal chronic microvascular ischemic change or sequela of migraine headaches than demyelination.  She will return to see me in 3 months or sooner based on the results of the imaging studies or if she has new or worsening neurologic symptoms.  Thank you for asking me to see Kaylee Cowan.  Please let me know if I can be of further assistance with her or other patients in the future.   Irvin Lizama A. Felecia Shelling, MD, [facility] 05/16/1959, 22:97 AM Certified in Neurology, Clinical Neurophysiology, Sleep Medicine  and Neuroimaging  [facility] [address] [place], [place] [address] [phone]

## 2020-08-27 ENCOUNTER — Telehealth: Payer: Self-pay | Admitting: Neurology

## 2020-08-27 NOTE — Telephone Encounter (Signed)
MD ordered MRI cervical spine w/wo contrast. Patient has [employer]. I filled out PA form and faxed to the plan. Phone: [phone]. Fax: [phone]. Pending.

## 2020-09-01 NOTE — Telephone Encounter (Signed)
Received approval from [employer]. PA #[id number] (08/27/20- 11/27/20). Faxed order to [facility] scheduling department. They will call patient to schedule. Phone: [phone]. Fax: [phone].

## 2020-09-02 ENCOUNTER — Telehealth: Payer: Self-pay

## 2020-09-02 NOTE — Telephone Encounter (Signed)
Called patient and gave her the following result note from Dr. Garen Lah:  Occasional PVCs/premature beats noted.  No significant arrhythmias noted.  Mayconsider beta-blocker for PVC suppression if patient has persistent symptoms.  Patient stated that she would like to try a beta-blocker before her follow up appointment on 09/12/20 if that was okay. She also wondered how that would work in addition to her Hydrochlorothiazide 12.5 MG, as she feels very sensitive to that even at the low dose.  Will route to Dr. Launa Grill for recommendation.

## 2020-09-06 ENCOUNTER — Ambulatory Visit (HOSPITAL_COMMUNITY)
Admission: RE | Admit: 2020-09-06 | Discharge: 2020-09-06 | Disposition: A | Discharge status: Home / Self Care | Payer: 59 | Source: Ambulatory Visit | Attending: Neurology | Admitting: Neurology

## 2020-09-06 ENCOUNTER — Other Ambulatory Visit: Payer: Self-pay

## 2020-09-06 DIAGNOSIS — R29818 Other symptoms and signs involving the nervous system: Secondary | ICD-10-CM | POA: Diagnosis present

## 2020-09-06 DIAGNOSIS — R200 Anesthesia of skin: Secondary | ICD-10-CM | POA: Diagnosis present

## 2020-09-06 MED ORDER — GADOBUTROL 1 MMOL/ML IV SOLN
10.0000 mL | Freq: Once | INTRAVENOUS | Status: AC | PRN
  Administered 2020-09-06: 10 mL via INTRAVENOUS

## 2020-09-08 ENCOUNTER — Telehealth: Payer: Self-pay | Admitting: Neurology

## 2020-09-08 DIAGNOSIS — R200 Anesthesia of skin: Secondary | ICD-10-CM

## 2020-09-08 DIAGNOSIS — G379 Demyelinating disease of central nervous system, unspecified: Secondary | ICD-10-CM

## 2020-09-08 MED ORDER — ZONISAMIDE 100 MG PO CAPS: ORAL_CAPSULE | ORAL | 11 refills | Status: DC

## 2020-09-08 NOTE — Telephone Encounter (Signed)
I spoke with Kaylee Cowan about the MRI results.  The MRI of the cervical spine showed a focus adjacent to C2-C3 posteriorly that likely explains her Lhermitte sign's.  It does not enhance so does not appear to be acute.  I discussed with her that given her age we need to be concerned about a possibility that she could develop multiple sclerosis.  Therefore, I would like to check an MRI of the brain and compare it with the MRI of the brain performed a year ago to see if there has been progression.  If there are new lesions (and they look consistent with demyelination), then MS becomes more likely.  If there are no new lesions, it would be more likely that the spot in her spinal cord was idiopathic, perhaps related to infection or vaccination.  I will place the MRI order.  She continues to have headaches and we had discussed switching Topamax to different medication.  I will place her on zonisamide at a higher effective dose.

## 2020-09-12 ENCOUNTER — Ambulatory Visit: Payer: Self-pay | Admitting: Cardiology

## 2020-09-16 NOTE — Telephone Encounter (Signed)
Completed [employer] form for MRI brain w/wo contrast. Faxed to plan at [phone]. Phone: [phone].

## 2020-09-17 ENCOUNTER — Ambulatory Visit: Payer: 59 | Admitting: Family Medicine

## 2020-09-17 NOTE — Telephone Encounter (Signed)
Received approval from plan. PA #[id number] (09/16/20- 12/17/20). Faxed order to the hospital. They will call patient to schedule like last time.

## 2020-09-19 NOTE — Telephone Encounter (Signed)
Patient coming in 10/31/20. Closing encounter.

## 2020-09-26 ENCOUNTER — Ambulatory Visit: Payer: 59

## 2020-10-03 ENCOUNTER — Other Ambulatory Visit: Payer: Self-pay

## 2020-10-03 ENCOUNTER — Ambulatory Visit
Admission: RE | Admit: 2020-10-03 | Discharge: 2020-10-03 | Disposition: A | Discharge status: Home / Self Care | Payer: 59 | Source: Ambulatory Visit | Attending: Neurology | Admitting: Neurology

## 2020-10-03 DIAGNOSIS — G379 Demyelinating disease of central nervous system, unspecified: Secondary | ICD-10-CM | POA: Insufficient documentation

## 2020-10-03 DIAGNOSIS — R200 Anesthesia of skin: Secondary | ICD-10-CM | POA: Insufficient documentation

## 2020-10-03 MED ORDER — GADOBUTROL 1 MMOL/ML IV SOLN
10.0000 mL | Freq: Once | INTRAVENOUS | Status: AC | PRN
  Administered 2020-10-03: 10 mL via INTRAVENOUS

## 2020-10-05 ENCOUNTER — Other Ambulatory Visit: Payer: Self-pay | Admitting: Family Medicine

## 2020-10-09 ENCOUNTER — Telehealth: Payer: Self-pay | Admitting: Neurology

## 2020-10-09 NOTE — Telephone Encounter (Signed)
I discussed the results of the recent brain MRI with Ms. Caughey.  To recap previous scans: MRI of the brain 09/14/2019 showed 3 or 4 nonspecific T2/FLAIR hyperintense foci in the juxtacortical and deep white matter.  They do not enhance.  MRI of the cervical spine MRI 09/06/2020 showed a nonenhancing lesion in the posterior spinal cord adjacent to C2-C3.  It did not enhance.  The   MRI of the brain 10/03/2020 showed 6 T2/FLAIR hyperintense foci in the hemispheres including 2 in the left splenium radially oriented to the ventricles, 2 juxtacortical foci and about 3 deep white matter foci.  None of the foci enhance.  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  Therefore, I will recommend that we begin a disease modifying therapy.  I will have her come in to discuss treatment options and to get necessary blood work.

## 2020-10-13 NOTE — Telephone Encounter (Signed)
Called pt. Scheduled appt for 10/16/20 at 830am w/ Dr. Felecia Shelling. Asked she check in by 8am.

## 2020-10-16 ENCOUNTER — Ambulatory Visit: Payer: 59 | Admitting: Neurology

## 2020-10-16 ENCOUNTER — Encounter: Payer: Self-pay | Admitting: Neurology

## 2020-10-16 ENCOUNTER — Telehealth: Payer: Self-pay | Admitting: *Deleted

## 2020-10-16 ENCOUNTER — Other Ambulatory Visit: Payer: Self-pay

## 2020-10-16 VITALS — BP 128/85 | HR 80 | Ht 65.0 in | Wt 230.5 lb

## 2020-10-16 DIAGNOSIS — G35 Multiple sclerosis: Secondary | ICD-10-CM | POA: Diagnosis not present

## 2020-10-16 DIAGNOSIS — R29818 Other symptoms and signs involving the nervous system: Secondary | ICD-10-CM | POA: Diagnosis not present

## 2020-10-16 DIAGNOSIS — R200 Anesthesia of skin: Secondary | ICD-10-CM | POA: Diagnosis not present

## 2020-10-16 DIAGNOSIS — N3941 Urge incontinence: Secondary | ICD-10-CM

## 2020-10-16 DIAGNOSIS — Z79899 Other long term (current) drug therapy: Secondary | ICD-10-CM

## 2020-10-16 DIAGNOSIS — G43709 Chronic migraine without aura, not intractable, without status migrainosus: Secondary | ICD-10-CM

## 2020-10-16 MED ORDER — VITAMIN D (ERGOCALCIFEROL) 1.25 MG (50000 UNIT) PO CAPS: 50000.0000 [IU] | ORAL_CAPSULE | ORAL | 3 refills | Status: DC

## 2020-10-16 NOTE — Progress Notes (Signed)
[facility]  PATIENT: Kaylee Cowan DOB: 04/05/1980  REFERRING DOCTOR OR PCP:  Micheline Rough, MD SOURCE: patient, notes from Neurology, PCP and ED, imaging and lab reports, MRI images personally reviewed.  _________________________________   HISTORICAL  CHIEF COMPLAINT:  Chief Complaint  Patient presents with   Follow-up    EMG 2/Routine Visit Alone in room    HISTORY OF PRESENT ILLNESS:  Kaylee Cowan is a 40 y.o. 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  Update 10/16/2020 She had an MRI of the brain 10/03/2020 that showed 7 T2/FLAIR hyperintense foci in the hemispheres including 2 in the left splenium that were radially oriented to the ventricles, 2 juxtacortical foci and 3 deep white matter foci.  None of the foci enhance.  At least 3 of these foci currently on the present MRI were not present on the MRI from 2021 consistent with progression and satisfying the dissemination in time criteria for MS.  Additionally, she has a focus within her cervical spine likely the source of the Lhermitte sign.  Physical changes, I have recommended that she begin a disease modifying therapy.  We spent the majority of today's visit discussing the risks and benefits of different therapies we spent the bulk of the time discussing Tysabri, Ocrevus and S1 P receptor modulators (Zeposia, Mayzent, Gilenya, Ponvory) we also discussed that if she is interested in the S1 P receptor modulator that we are currently enrolling for open label study to further test association between cognition and MRI  To recap:   She had the onset of a Lhermitte sign March 2021 and had two other episodes of neurologic symptoms including numbness in her thigh lasting may months in 2019.  She also started getting intermittent numbness in er fingers starting in 2021.    In 09/2019 she had left leg > arm heaviness and numbness lasting a week or two.   MRI at that time showed 2 foci on her MRI and she was felt to have a  complicated migraine.    Medical issues include HTN and migraine.      History of MS: She developed a Lhermitte sign in 2021 a week or two after the first Covid-19 vaccination [employer]) in March 2021.  She also had pain behind her eyes.    She had similar eye pain after the 2nd shot and after her booster later in 2021.  When she bends her head, she gets a buzzing sensation down her neck. These occur multiple times a day and just lasts a few seconds.   The sensation is buzzing not pain.   She feels it into her arms, trunks and proximal legs.  She went to orthopedics as she also had also had some mid and lower back pain.  She did PT but she continues to experience these symptoms.   Last year, she had an episode of left sided numbness and weakness that lasted a couple hours.     She went to the ED and had an MRI showing 2 T2 hyperintense foci.     She was felt to have a complicated migraine.  Currently she is experiencing the Lhermitte sign most days.   She notes numbness in her hands, right > left, with a sensation it is going to sleep with pins/needles dysesthesia.   Sh often moves her arm to see if it improves but it usually does not.    It does not awaken her at night.  She notes no significant hand weakness and does note  mild weakness in her legs at times.   She has some urge incontinence that actually started several years ago.    She was placed on Myrbetriq a few years ago and never had a good explanation for urge incontinence.  She has migraine headaches since age 80, after starting OCP (Yaz).  Topiramate has helped reduce from daily to 3-4 a month.   When one occurs, she takes Excedrin Migraine and Magnesium.  With a migraine she gets photophobia, phonophobia and pain wrosens with movements of the head.   Sh sometimes has nausea but not vomiting.  Pain is often right sided and occasional left sided with a throbbing quality.  She is currently on only 50 mg daily.  She has Vit D deficiency and takes a  supplement.   She is on a progesterone IUD and I advised her she should discuss further with hr gynecologist as topiramate may reduce the efficacy.     She has palpitations and currently has a path to analyze.  She has seen rheumatology for joint inflammation.  Some rheumatology tests were reportedly positive.   She was placed on gabapentin for the pain.   It was poorly tolerated and she stopped.   Labs were with [facility] (Dr. Collene Leyden) and she also saw [facility] (Dr. Ernestina Columbia).  She also saw a sleep doctor there for hypersomnolence.    Vit D was 19.9.   Heavy metal test showed increase nickel ad tin   Aluminum was borderline high.   I personally reviewed the MRI of the brain from 09/14/2019.  It shows 2 T2/FLAIR hyperintense foci in the subcortical white matter of the left frontal lobe.  Neither these appear to be acute.  They do not enhance.  No periventricular or infratentorial foci were noted.  REVIEW OF SYSTEMS: Constitutional: No fevers, chills, sweats, or change in appetite Eyes: No visual changes, double vision, eye pain Ear, nose and throat: No hearing loss, ear pain, nasal congestion, sore throat Cardiovascular: No chest pain, palpitations Respiratory:  No shortness of breath at rest or with exertion.   No wheezes GastrointestinaI: No nausea, vomiting, diarrhea, abdominal pain, fecal incontinence Genitourinary:  No dysuria, urinary retention or frequency.  No nocturia. Musculoskeletal:  No neck pain, back pain Integumentary: No rash, pruritus, skin lesions Neurological: as above Psychiatric: No depression at this time.  No anxiety Endocrine: No palpitations, diaphoresis, change in appetite, change in weigh or increased thirst Hematologic/Lymphatic:  No anemia, purpura, petechiae. Allergic/Immunologic: No itchy/runny eyes, nasal congestion, recent allergic reactions, rashes  ALLERGIES: Allergies  Allergen Reactions   Bee Venom Anaphylaxis    HOME MEDICATIONS:  Current  Outpatient Medications:    albuterol (VENTOLIN HFA) 108 (90 Base) MCG/ACT inhaler, Inhale 1-2 puffs into the lungs every 4 (four) hours as needed for wheezing or shortness of breath., Disp: 1 each, Rfl: 0   EPINEPHrine (EPIPEN 2-PAK) 0.3 mg/0.3 mL IJ SOAJ injection, Inject 0.3 mg into the muscle once as needed for up to 1 dose (for severe allergic reaction). CAll 911 immediately if you have to use this medicine, Disp: 1 each, Rfl: 1   Ferrous Fumarate (IRON) 18 MG TBCR, Take by mouth., Disp: , Rfl:    hydrochlorothiazide (HYDRODIURIL) 12.5 MG tablet, TAKE 1 TABLET BY MOUTH EVERY DAY, Disp: 90 tablet, Rfl: 1   levonorgestrel (LILETTA, 52 MG,) 19.5 MCG/DAY IUD IUD, 1 each by Intrauterine route once., Disp: , Rfl:    magnesium 30 MG tablet, Take 30 mg by mouth daily., Disp: ,  Rfl:    Multiple Vitamin (MULTIVITAMIN ADULT) TABS, Take by mouth., Disp: , Rfl:    VITAMIN D PO, Take 500 Units by mouth daily., Disp: , Rfl:    Vitamin D, Ergocalciferol, (DRISDOL) 1.25 MG (50000 UNIT) CAPS capsule, Take 1 capsule (50,000 Units total) by mouth every 7 (seven) days., Disp: 13 capsule, Rfl: 3   zonisamide (ZONEGRAN) 100 MG capsule, Two po qHS, Disp: 60 capsule, Rfl: 11  PAST MEDICAL HISTORY: Past Medical History:  Diagnosis Date   Anemia    Asthma    Giant cell tumor    Hypersomnolence    Migraines    Sleep disorder     PAST SURGICAL HISTORY: Past Surgical History:  Procedure Laterality Date   MASS EXCISION Right 05/16/2019   Procedure: Excision right wrist volar mass;  Surgeon: Iran Planas, MD;  Location: [facility];  Service: Orthopedics;  Laterality: Right;  with IV sedation   NASAL SEPTUM SURGERY     tissue removed from nose   REFRACTIVE SURGERY     ROTATOR CUFF REPAIR     TONSILLECTOMY      FAMILY HISTORY: Family History  Problem Relation Age of Onset   Diabetes Mother    Hypertension Father    Hyperlipidemia Father    Aortic dissection Father 86   Early death Maternal  Grandmother    Aortic dissection Maternal Grandmother 23   Early death Maternal Grandfather    Heart attack Paternal Grandmother    Lupus Sister    Eczema Niece     SOCIAL HISTORY:  Social History   Socioeconomic History   Marital status: Single    Spouse name: Not on file   Number of children: 0   Years of education: 70   Highest education level: Not on file  Occupational History   Not on file  Tobacco Use   Smoking status: Never   Smokeless tobacco: Never  Vaping Use   Vaping Use: Never used  Substance and Sexual Activity   Alcohol use: Not Currently   Drug use: Never   Sexual activity: Not Currently    Birth control/protection: I.U.D.  Other Topics Concern   Not on file  Social History Narrative   Right handed   Caffeine use: 3 times per week (coffee, no soda, energy drink once per week)   Social Determinants of Health   Financial Resource Strain: Not on file  Food Insecurity: Not on file  Transportation Needs: Not on file  Physical Activity: Not on file  Stress: Not on file  Social Connections: Not on file  Intimate Partner Violence: Not on file     PHYSICAL EXAM  Vitals:   10/16/20 0803  BP: 128/85  Pulse: 80  Weight: 230 lb 8 oz (104.6 kg)  Height: '5\' 5"'$  (1.651 m)    Body mass index is 38.36 kg/m.   General: The patient is well-developed and well-nourished and in no acute distress  HEENT:  Head is [place]/AT.  Sclera are anicteric.  Funduscopic exam shows normal optic discs and retinal vessels.  Neck: No carotid bruits are noted.  The neck is nontender.  Cardiovascular: The heart has a regular rate and rhythm with a normal S1 and S2. There were no murmurs, gallops or rubs.    Skin: Extremities are without rash or  edema.  Musculoskeletal:  Back is nontender  Neurologic Exam  Mental status: The patient is alert and oriented x 3 at the time of the examination. The patient has  apparent normal recent and remote memory, with an apparently normal  attention span and concentration ability.   Speech is normal.  Cranial nerves: Extraocular movements are full. Pupils are equal, round, and reactive to light and accomodation.  Visual fields are full.  Facial symmetry is present. There is good facial sensation to soft touch bilaterally.Facial strength is normal.  Trapezius and sternocleidomastoid strength is normal. No dysarthria is noted.  The tongue is midline, and the patient has symmetric elevation of the soft palate. No obvious hearing deficits are noted.  Motor:  Muscle bulk is normal.   Tone is normal. Strength is  5 / 5 in all 4 extremities.   Sensory: Intact to touch and sensation. Coordination: Cerebellar testing reveals good finger-nose-finger and heel-to-shin bilaterally.  Gait and station: Station is normal.   Gait is normal. Tandem gait is mildly wide. Romberg is negative.   Reflexes: Deep tendon reflexes are symmetric and normal bilaterally.       DIAGNOSTIC DATA (LABS, IMAGING, TESTING) - I reviewed patient records, labs, notes, testing and imaging myself where available.  Lab Results  Component Value Date   WBC 9.7 06/13/2020   HGB 12.5 06/13/2020   HCT 38.7 06/13/2020   MCV 84.1 06/13/2020   PLT 410 (H) 06/13/2020      Component Value Date/Time   NA 138 06/13/2020 2024   K 3.8 06/13/2020 2024   CL 105 06/13/2020 2024   CO2 23 06/13/2020 2024   GLUCOSE 99 06/13/2020 2024   BUN 17 06/13/2020 2024   CREATININE 0.70 06/13/2020 2024   CREATININE 0.73 09/25/2019 0832   CALCIUM 9.3 06/13/2020 2024   PROT 7.4 10/16/2020 0928   ALBUMIN 4.3 10/16/2020 0928   AST 13 10/16/2020 0928   ALT 10 10/16/2020 0928   ALKPHOS 101 10/16/2020 0928   BILITOT 0.5 10/16/2020 0928   GFRNONAA >60 06/13/2020 2024   GFRAA >60 09/14/2019 2154   Lab Results  Component Value Date   CHOL 184 09/25/2019   HDL 58 09/25/2019   LDLCALC 113 (H) 09/25/2019   TRIG 51 09/25/2019   CHOLHDL 3.2 09/25/2019   Lab Results  Component Value  Date   HGBA1C 5.2 09/25/2019   Lab Results  Component Value Date   [id number] 09/25/2019   Lab Results  Component Value Date   TSH 1.25 07/07/2020       ASSESSMENT AND PLAN  Multiple sclerosis ([place]) - Plan: Stratify JCV Antibody Test ([facility]), Varicella zoster antibody, IgG, QuantiFERON-TB Gold Plus, Hepatitis B core antibody, total, Hepatitis B surface antigen, Hepatitis B surface antibody,qualitative, Hepatic function panel, CYP2C9 Genotyping Siponimod  High risk medication use - Plan: Stratify JCV Antibody Test ([facility]), Varicella zoster antibody, IgG, QuantiFERON-TB Gold Plus, Hepatitis B core antibody, total, Hepatitis B surface antigen, Hepatitis B surface antibody,qualitative, Hepatic function panel, CYP2C9 Genotyping Siponimod  Numbness  Lhermitte's sign positive  Chronic migraine without aura without status migrainosus, not intractable  Urge incontinence   As her current MRI shows that there has been progression of the white matter changes that are located in the periventricular, juxtacortical and deep white matter and she also has a focus in the upper cervical spine that was symptomatic, she now meets McDonald criteria for MS.  We will start a disease modifying therapy.  We discussed multiple options.  The majority of the time discussing S1 P receptor modulators, Ocrevus and Tysabri.  I gave her further information on these and we will check blood work to help make a  decision.  I will talk to her once the results have returned. Stay active and exercise as tolerated. Vitamin D was low and we will supplemented She will return based on the results of the studies and her decision for treatment.  50  minute office visit with the majority of the time spent face-to-face for history and physical, discussion/counseling and decision-making.  Additional time with record review and documentation.   Ariani Seier A. Felecia Shelling, MD, [facility] [id number], [id number] AM Certified in Neurology,  Clinical Neurophysiology, Sleep Medicine and Neuroimaging  [facility], [facility] [address], [place], [place] [address] [phone]

## 2020-10-16 NOTE — Telephone Encounter (Signed)
Placed JCV lab in [facility] lock box for routine lab pick up. Results pending. 

## 2020-10-19 DIAGNOSIS — G35 Multiple sclerosis: Secondary | ICD-10-CM | POA: Insufficient documentation

## 2020-10-19 DIAGNOSIS — Z79899 Other long term (current) drug therapy: Secondary | ICD-10-CM | POA: Insufficient documentation

## 2020-10-22 ENCOUNTER — Telehealth (INDEPENDENT_AMBULATORY_CARE_PROVIDER_SITE_OTHER): Payer: 59 | Admitting: Family Medicine

## 2020-10-22 ENCOUNTER — Encounter: Payer: Self-pay | Admitting: Family Medicine

## 2020-10-22 VITALS — Temp 98.5°F | Wt 229.0 lb

## 2020-10-22 DIAGNOSIS — Z13220 Encounter for screening for lipoid disorders: Secondary | ICD-10-CM

## 2020-10-22 DIAGNOSIS — L309 Dermatitis, unspecified: Secondary | ICD-10-CM | POA: Diagnosis not present

## 2020-10-22 DIAGNOSIS — E559 Vitamin D deficiency, unspecified: Secondary | ICD-10-CM

## 2020-10-22 MED ORDER — TRIAMCINOLONE ACETONIDE 0.5 % EX OINT: 1.0000 "application " | TOPICAL_OINTMENT | Freq: Two times a day (BID) | CUTANEOUS | 0 refills | Status: DC

## 2020-10-22 NOTE — Progress Notes (Signed)
Virtual Visit via Video Note  I connected with Kaylee Cowan on 10/22/20 at  1:30 PM EDT by a video enabled telemedicine application and verified that I am speaking with the correct person using two identifiers.  Location patient: home Location provider: [place], [place] [address] Persons participating in the virtual visit: patient, provider  I discussed the limitations of evaluation and management by telemedicine and the availability of in person appointments. The patient expressed understanding and agreed to proceed.   Kaylee Cowan DOB: Jan 05, 1981 Encounter date: 10/22/2020  This is a 40 y.o. female who presents with Chief Complaint  Patient presents with   Rash    Patient complains of itchy rash noted above the right areola x2 weeks, tried Neosporin, Benadryl cream and anti-fungal cream with no relief    History of present illness:  When started a little bumpy - just a bump or two. Now it is scaly, rough and itchy. Hasn't spread. About size of thumb nail. Looks like eczema. No drainage, no blisters. Currently looks a little darker.    Allergies  Allergen Reactions   Bee Venom Anaphylaxis   Current Meds  Medication Sig   albuterol (VENTOLIN HFA) 108 (90 Base) MCG/ACT inhaler Inhale 1-2 puffs into the lungs every 4 (four) hours as needed for wheezing or shortness of breath.   EPINEPHrine (EPIPEN 2-PAK) 0.3 mg/0.3 mL IJ SOAJ injection Inject 0.3 mg into the muscle once as needed for up to 1 dose (for severe allergic reaction). CAll 911 immediately if you have to use this medicine   Ferrous Fumarate (IRON) 18 MG TBCR Take by mouth.   hydrochlorothiazide (HYDRODIURIL) 12.5 MG tablet TAKE 1 TABLET BY MOUTH EVERY DAY   levonorgestrel (LILETTA, 52 MG,) 19.5 MCG/DAY IUD IUD 1 each by Intrauterine route once.   Multiple Vitamin (MULTIVITAMIN ADULT) TABS Take by mouth.   Vitamin D, Ergocalciferol, (DRISDOL) 1.25 MG (50000  UNIT) CAPS capsule Take 1 capsule (50,000 Units total) by mouth every 7 (seven) days.   zonisamide (ZONEGRAN) 100 MG capsule Two po qHS    Review of Systems  Constitutional:  Negative for chills, fatigue and fever.  Respiratory:  Negative for cough, chest tightness, shortness of breath and wheezing.   Cardiovascular:  Negative for chest pain, palpitations and leg swelling.   Objective:  Temp 98.5 F (36.9 C)   Wt 229 lb (103.9 kg)   BMI 38.11 kg/m   Weight: 229 lb (103.9 kg)   BP Readings from Last 3 Encounters:  10/16/20 128/85  08/20/20 110/79  08/07/20 112/76   Wt Readings from Last 3 Encounters:  10/22/20 229 lb (103.9 kg)  10/16/20 230 lb 8 oz (104.6 kg)  08/20/20 233 lb 8 oz (105.9 kg)    EXAM:  GENERAL: alert, oriented, appears well and in no acute distress  HEENT: atraumatic, conjunctiva clear, no obvious abnormalities on inspection of external nose and ears  NECK: normal movements of the head and neck  LUNGS: on inspection no signs of respiratory distress, breathing rate appears normal, no obvious gross SOB, gasping or wheezing  CV: no obvious cyanosis  MS: moves all visible extremities without noticeable abnormality  PSYCH/NEURO: pleasant and cooperative, no obvious depression or anxiety, speech and thought processing grossly intact   Assessment/Plan  1. Eczema, unspecified type Single patch of scaly skin not resolving with antifungal, antibacterial, benadryl. Will try topical steroid. If not improving in 1 week and resolved in 2  weeks would benefit from in person evaluation. Patient in car today for visit so unable to visualize rash.   2. Lipid screening - Lipid panel; Future  3. Vitamin D deficiency Has been supplementing with 50,000 units weekly on and off for past year and in between took otc dosing. Would like new baseline.  - VITAMIN D 25 Hydroxy (Vit-D Deficiency, Fractures); Future   of noted, she has seen neurology in follow up and received  official dx of MS. She is working on starting treatment at this point and feels somewhat relieved to have some answers and begin managing with active plan.  I discussed the assessment and treatment plan with the patient. The patient was provided an opportunity to ask questions and all were answered. The patient agreed with the plan and demonstrated an understanding of the instructions.   The patient was advised to call back or seek an in-person evaluation if the symptoms worsen or if the condition fails to improve as anticipated.  I provided 20 minutes of non-face-to-face time during this encounter.   Micheline Rough, MD

## 2020-10-23 ENCOUNTER — Other Ambulatory Visit: Payer: Self-pay

## 2020-10-23 ENCOUNTER — Other Ambulatory Visit (INDEPENDENT_AMBULATORY_CARE_PROVIDER_SITE_OTHER): Payer: 59

## 2020-10-23 DIAGNOSIS — Z13220 Encounter for screening for lipoid disorders: Secondary | ICD-10-CM

## 2020-10-23 DIAGNOSIS — E559 Vitamin D deficiency, unspecified: Secondary | ICD-10-CM | POA: Diagnosis not present

## 2020-10-23 LAB — LIPID PANEL
Cholesterol: 203 mg/dL — ABNORMAL HIGH (ref 0–200)
HDL: 46.3 mg/dL (ref >39.00)
LDL Cholesterol: 142 mg/dL — ABNORMAL HIGH (ref 0–99)
NonHDL: 156.26
Total CHOL/HDL Ratio: 4
Triglycerides: 73 mg/dL (ref 0.0–149.0)
VLDL: 14.6 mg/dL (ref 0.0–40.0)

## 2020-10-23 LAB — HEPATIC FUNCTION PANEL
ALT: 10 IU/L (ref 0–32)
AST: 13 IU/L (ref 0–40)
Albumin: 4.3 g/dL (ref 3.8–4.8)
Alkaline Phosphatase: 101 IU/L (ref 44–121)
Bilirubin Total: 0.5 mg/dL (ref 0.0–1.2)
Bilirubin, Direct: 0.14 mg/dL (ref 0.00–0.40)
Total Protein: 7.4 g/dL (ref 6.0–8.5)

## 2020-10-23 LAB — QUANTIFERON-TB GOLD PLUS
QuantiFERON Mitogen Value: >10 IU/mL
QuantiFERON Nil Value: 0 IU/mL
QuantiFERON TB1 Ag Value: 0.01 IU/mL
QuantiFERON TB2 Ag Value: 0 IU/mL
QuantiFERON-TB Gold Plus: NEGATIVE

## 2020-10-23 LAB — HEPATITIS B CORE ANTIBODY, TOTAL: Hep B Core Total Ab: NEGATIVE

## 2020-10-23 LAB — HEPATITIS B SURFACE ANTIBODY,QUALITATIVE: Hep B Surface Ab, Qual: REACTIVE

## 2020-10-23 LAB — VITAMIN D 25 HYDROXY (VIT D DEFICIENCY, FRACTURES): VITD: 29.89 ng/mL — ABNORMAL LOW (ref 30.00–100.00)

## 2020-10-23 LAB — VARICELLA ZOSTER ANTIBODY, IGG: Varicella zoster IgG: 3550 index (ref >165)

## 2020-10-23 LAB — HEPATITIS B SURFACE ANTIGEN: Hepatitis B Surface Ag: NEGATIVE

## 2020-10-23 LAB — CYP2C9 GENOTYPING SIPONIMOD

## 2020-10-27 ENCOUNTER — Telehealth: Payer: Self-pay

## 2020-10-27 NOTE — Telephone Encounter (Signed)
JCV ab drawn on 10/16/20 positive, index: 0.52.

## 2020-10-27 NOTE — Telephone Encounter (Signed)
I have communicated with pt via mychart. She is working on signing her Tysabri Enrollment form.

## 2020-10-27 NOTE — Telephone Encounter (Signed)
-----   Message from Britt Bottom, MD sent at 10/27/2020  8:50 AM EDT ----- Labs are fine.   She had a low positive JCV Ab - we had discussed Tysabri if negative or low positive (vs Ocrevus if middle/high) so she can get started on Ty

## 2020-10-28 NOTE — Telephone Encounter (Signed)
Patient presented to the lobby because she could not sign the Tysabri start form electronically.  She signed the Tysabri start form in person.  I advised her I would send this to [facility] today.  Our infusion suite will start working on getting insurance approval and then will call her to schedule her Tysabri infusions.  Patient had no questions or concerns at this time.  Tysabri start form faxed to [facility].  Received a receipt of confirmation.  Tysabri order and Tysabri start form going to infusion suite for processing.

## 2020-10-31 ENCOUNTER — Encounter: Payer: Self-pay | Admitting: Cardiology

## 2020-10-31 ENCOUNTER — Ambulatory Visit (INDEPENDENT_AMBULATORY_CARE_PROVIDER_SITE_OTHER): Payer: 59 | Admitting: Cardiology

## 2020-10-31 ENCOUNTER — Other Ambulatory Visit: Payer: Self-pay

## 2020-10-31 VITALS — BP 110/82 | HR 100 | Ht 65.0 in | Wt 231.0 lb

## 2020-10-31 DIAGNOSIS — Z6838 Body mass index (BMI) 38.0-38.9, adult: Secondary | ICD-10-CM | POA: Diagnosis not present

## 2020-10-31 DIAGNOSIS — I493 Ventricular premature depolarization: Secondary | ICD-10-CM

## 2020-10-31 NOTE — Progress Notes (Signed)
Cardiology Office Note:    Date:  10/31/2020   ID:  Kaylee Cowan, DOB 1980/04/06, MRN [id number]  PCP:  Caren Macadam, MD   [facility] Providers Cardiologist:  None     Referring MD: Caren Macadam, MD   Chief Complaint  Patient presents with   Other    Follow up post [place] reviewed verbally with patient.     History of Present Illness:    Kaylee Cowan is a 40 y.o. female with a hx of asthma, obesity, who presents for follow-up, previously seen due to irregular heartbeats.    Cardiac monitor was placed to evaluate any significant arrhythmias.  She states her symptoms have overall improved, occurring less frequently.  Denies dizziness, syncope.  Was previously placed on HCTZ for elevated blood pressures.  This usually causes her blood pressure to go low when she takes HCTZ.  She has not taken BP med for the past 2 weeks, blood pressures have been normal.  She was recently diagnosed with MS, is seeing neurology in [place].  Has been trying to lose weight, which is very frustrating.  Prior notes Echocardiogram [id number] normal systolic and diastolic function, EF 60 to 65%.  Past Medical History:  Diagnosis Date   Anemia    Asthma    Giant cell tumor    Hypersomnolence    Migraines    Sleep disorder     Past Surgical History:  Procedure Laterality Date   MASS EXCISION Right 05/16/2019   Procedure: Excision right wrist volar mass;  Surgeon: Iran Planas, MD;  Location: [place];  Service: Orthopedics;  Laterality: Right;  with IV sedation   NASAL SEPTUM SURGERY     tissue removed from nose   REFRACTIVE SURGERY     ROTATOR CUFF REPAIR     TONSILLECTOMY      Current Medications: Current Meds  Medication Sig   albuterol (VENTOLIN HFA) 108 (90 Base) MCG/ACT inhaler Inhale 1-2 puffs into the lungs every 4 (four) hours as needed for wheezing or shortness of breath.   EPINEPHrine (EPIPEN 2-PAK) 0.3 mg/0.3 mL IJ SOAJ  injection Inject 0.3 mg into the muscle once as needed for up to 1 dose (for severe allergic reaction). CAll 911 immediately if you have to use this medicine   Ferrous Fumarate (IRON) 18 MG TBCR Take by mouth.   levonorgestrel (LILETTA, 52 MG,) 19.5 MCG/DAY IUD IUD 1 each by Intrauterine route once.   Multiple Vitamin (MULTIVITAMIN ADULT) TABS Take by mouth.   triamcinolone ointment (KENALOG) 0.5 % Apply 1 application topically 2 (two) times daily.   Vitamin D, Ergocalciferol, (DRISDOL) 1.25 MG (50000 UNIT) CAPS capsule Take 1 capsule (50,000 Units total) by mouth every 7 (seven) days.   zonisamide (ZONEGRAN) 100 MG capsule Two po qHS   [DISCONTINUED] hydrochlorothiazide (HYDRODIURIL) 12.5 MG tablet TAKE 1 TABLET BY MOUTH EVERY DAY     Allergies:   Bee venom   Social History   Socioeconomic History   Marital status: Single    Spouse name: Not on file   Number of children: 0   Years of education: 38   Highest education level: Not on file  Occupational History   Not on file  Tobacco Use   Smoking status: Never   Smokeless tobacco: Never  Vaping Use   Vaping Use: Never used  Substance and Sexual Activity   Alcohol use: Not Currently   Drug use: Never   Sexual activity:  Not Currently    Birth control/protection: I.U.D.  Other Topics Concern   Not on file  Social History Narrative   Right handed   Caffeine use: 3 times per week (coffee, no soda, energy drink once per week)   Social Determinants of Health   Financial Resource Strain: Not on file  Food Insecurity: Not on file  Transportation Needs: Not on file  Physical Activity: Not on file  Stress: Not on file  Social Connections: Not on file     Family History: The patient's family history includes Aortic dissection (age of onset: 59) in her father and maternal grandmother; Diabetes in her mother; Early death in her maternal grandfather and maternal grandmother; Eczema in her niece; Heart attack in her paternal  grandmother; Hyperlipidemia in her father; Hypertension in her father; Lupus in her sister.  ROS:   Please see the history of present illness.     All other systems reviewed and are negative.  EKGs/Labs/Other Studies Reviewed:    The following studies were reviewed today:   EKG:  EKG is  ordered today.  The ekg ordered today demonstrates normal sinus rhythm, normal ECG  Recent Labs: 06/13/2020: BUN 17; Creatinine, Ser 0.70; Hemoglobin 12.5; Magnesium 1.8; Platelets 410; Potassium 3.8; Sodium 138 07/07/2020: TSH 1.25 10/16/2020: ALT 10  Recent Lipid Panel    Component Value Date/Time   CHOL 203 (H) 10/23/2020 0739   TRIG 73.0 10/23/2020 0739   HDL 46.30 10/23/2020 0739   CHOLHDL 4 10/23/2020 0739   VLDL 14.6 10/23/2020 0739   LDLCALC 142 (H) 10/23/2020 0739   LDLCALC 113 (H) 09/25/2019 0832     Risk Assessment/Calculations:      Physical Exam:    VS:  BP 110/82 (BP Location: Left Arm, Patient Position: Sitting, Cuff Size: Large)   Pulse 100   Ht '5\' 5"'$  (1.651 m)   Wt 231 lb (104.8 kg)   SpO2 97%   BMI 38.44 kg/m     Wt Readings from Last 3 Encounters:  10/31/20 231 lb (104.8 kg)  10/22/20 229 lb (103.9 kg)  10/16/20 230 lb 8 oz (104.6 kg)     GEN:  Well nourished, well developed in no acute distress HEENT: Normal NECK: No JVD; No carotid bruits LYMPHATICS: No lymphadenopathy CARDIAC: RRR, no murmurs, rubs, gallops RESPIRATORY:  Clear to auscultation without rales, wheezing or rhonchi  ABDOMEN: Soft, non-tender, non-distended MUSCULOSKELETAL:  No edema; No deformity  SKIN: Warm and dry NEUROLOGIC:  Alert and oriented x 3 PSYCHIATRIC:  Normal affect   ASSESSMENT:    1. PVC (premature ventricular contraction)   2. BMI 38.0-38.9,adult     PLAN:    In order of problems listed above:  Irregular heartbeat, cardiac monitor showed occasional PVCs,no other significant arrhythmias.  PVC burden 1.1%.  Echo 09/2019 with normal systolic and diastolic function, EF 60  to 65%.  Symptoms overall improved, continue to monitor for now.  If they become worse, consider starting low-dose beta-blocker. Obesity, weight loss recommended.  Follow-up in 6 months.     Medication Adjustments/Labs and Tests Ordered: Current medicines are reviewed at length with the patient today.  Concerns regarding medicines are outlined above.  No orders of the defined types were placed in this encounter.  No orders of the defined types were placed in this encounter.   Patient Instructions  Medication Instructions:  Your physician recommends that you continue on your current medications as directed. Please refer to the Current Medication list given to you today.  *  If you need a refill on your cardiac medications before your next appointment, please call your pharmacy*   Lab Work: None ordered If you have labs (blood work) drawn today and your tests are completely normal, you will receive your results only by: [place] (if you have MyChart) OR A paper copy in the mail If you have any lab test that is abnormal or we need to change your treatment, we will call you to review the results.   Testing/Procedures: None ordered   Follow-Up: At [facility], you and your health needs are our priority.  As part of our continuing mission to provide you with exceptional heart care, we have created designated Provider Care Teams.  These Care Teams include your primary Cardiologist (physician) and Advanced Practice Providers (APPs -  Physician Assistants and Nurse Practitioners) who all work together to provide you with the care you need, when you need it.  We recommend signing up for the patient portal called "MyChart".  Sign up information is provided on this After Visit Summary.  MyChart is used to connect with patients for Virtual Visits (Telemedicine).  Patients are able to view lab/test results, encounter notes, upcoming appointments, etc.  Non-urgent messages can be sent to  your provider as well.   To learn more about what you can do with MyChart, go to [email or web address].    Your next appointment:   6 month(s)  The format for your next appointment:   In Person  Provider:   You may see Dr. Garen Lah or one of the following Advanced Practice Providers on your designated Care Team:   Murray Hodgkins, NP Christell Faith, PA-C Marrianne Mood, PA-C Cadence [place], [place]   Other Instructions    Signed, Kate Sable, MD  10/31/2020 12:33 PM    [place]

## 2020-10-31 NOTE — Patient Instructions (Signed)
Medication Instructions:  Your physician recommends that you continue on your current medications as directed. Please refer to the Current Medication list given to you today.  *If you need a refill on your cardiac medications before your next appointment, please call your pharmacy*   Lab Work: None ordered If you have labs (blood work) drawn today and your tests are completely normal, you will receive your results only by: [place] (if you have MyChart) OR A paper copy in the mail If you have any lab test that is abnormal or we need to change your treatment, we will call you to review the results.   Testing/Procedures: None ordered   Follow-Up: At [facility], you and your health needs are our priority.  As part of our continuing mission to provide you with exceptional heart care, we have created designated Provider Care Teams.  These Care Teams include your primary Cardiologist (physician) and Advanced Practice Providers (APPs -  Physician Assistants and Nurse Practitioners) who all work together to provide you with the care you need, when you need it.  We recommend signing up for the patient portal called "MyChart".  Sign up information is provided on this After Visit Summary.  MyChart is used to connect with patients for Virtual Visits (Telemedicine).  Patients are able to view lab/test results, encounter notes, upcoming appointments, etc.  Non-urgent messages can be sent to your provider as well.   To learn more about what you can do with MyChart, go to [email or web address].    Your next appointment:   6 month(s)  The format for your next appointment:   In Person  Provider:   You may see Dr. Garen Lah or one of the following Advanced Practice Providers on your designated Care Team:   Murray Hodgkins, NP Christell Faith, PA-C Marrianne Mood, PA-C Cadence [place], [place]   Other Instructions

## 2020-11-25 ENCOUNTER — Ambulatory Visit: Payer: 59 | Admitting: Neurology

## 2020-11-25 ENCOUNTER — Encounter: Payer: Self-pay | Admitting: Neurology

## 2020-11-25 VITALS — BP 125/83 | HR 88 | Ht 65.0 in | Wt 234.0 lb

## 2020-11-25 DIAGNOSIS — N3941 Urge incontinence: Secondary | ICD-10-CM

## 2020-11-25 DIAGNOSIS — G35 Multiple sclerosis: Secondary | ICD-10-CM | POA: Diagnosis not present

## 2020-11-25 DIAGNOSIS — Z79899 Other long term (current) drug therapy: Secondary | ICD-10-CM

## 2020-11-25 DIAGNOSIS — R29818 Other symptoms and signs involving the nervous system: Secondary | ICD-10-CM

## 2020-11-25 DIAGNOSIS — G4730 Sleep apnea, unspecified: Secondary | ICD-10-CM

## 2020-11-25 DIAGNOSIS — G4710 Hypersomnia, unspecified: Secondary | ICD-10-CM | POA: Diagnosis not present

## 2020-11-25 DIAGNOSIS — R200 Anesthesia of skin: Secondary | ICD-10-CM

## 2020-11-25 MED ORDER — ARMODAFINIL 200 MG PO TABS: ORAL_TABLET | ORAL | 5 refills | Status: DC

## 2020-11-25 NOTE — Progress Notes (Signed)
[facility]  PATIENT: Kaylee Cowan DOB: [birth year]  REFERRING DOCTOR OR PCP:  Micheline Rough, MD SOURCE: patient, notes from Neurology, PCP and ED, imaging and lab reports, MRI images personally reviewed.  _________________________________   HISTORICAL  CHIEF COMPLAINT:  Chief Complaint  Patient presents with   Consult    Rm 1, alone. Pt here for a sleep consult due to excessive daytime sleepiness and fatigue. Pt was dx with idopathic hypersomnolence and mild OSA. Was following up w Dr. Brett Fairy. Pt was on Ritalin for a while.     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 York Pellant).  She had a PSG/MSLT I 2019 and reportedly had no OSA but had a short sleep latency.     She has spells where she gets emotional and gets sleepy and falls asleep.  She has vivid dreams but does not have sleep paralysis.    Short naps are not physically efreshing though she feels 'emotionally better'.   She takes multiple naps during the day.   She sleep 7-8 hours at night.   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   She had an extended release Ritalin which helped her.   MSLT in 2019 did not show narcolepsy but she had a trace of stimulant on the tox screen (was off for several days though).   There was no REM sleep but Mean sleep latency of 2.3 monutes.    Shee snores some but was not noted to have OSA signs by her husband.   She has woken herself up with snoring at times.  She had gained weight in 2020 and a HST showed mild OSA abd AutoPAP was discussed with her but she opted not to start.    An oral appliance  Zeposia MS has been stable since her last visit.  She is in the process of getting started on Tysabri.   She has a JCV low positive 0.52.     Her MRI of the brain 10/03/2020 that  showed 7 T2/FLAIR hyperintense foci in the hemispheres including 2 in the left splenium that were radially oriented to the ventricles, 2 juxtacortical foci and 3 deep white matter foci.  None of the foci enhance.  At least 3 of these foci currently on the present MRI were not present on the MRI from 2021 consistent with progression and satisfying the dissemination in time criteria for MS.  Additionally, she has a focus within her cervical spine likely the source of the Lhermitte sign.     She has not had any exaceraions in last month   Medical issues include HTN and migraine.    Topamax was switched to zonisamide for less interaction to IUD.   She has GI upset with zonisamide though it caused upset stomach.      EPWORTH SLEEPINESS SCALE  On a scale of 0 - 3 what is the chance of dozing:  Sitting and Reading:   3 Watching TV:    3 Sitting inactive in a public place: 2 Passenger in car for one hour: 3 Lying down to rest in the afternoon: 3 Sitting and talking to someone: 1 Sitting quietly after lunch:  3 In a car, stopped in traffic:  1  Total (out of 24):    19/24   (severe sleepiness).  History of MS: She developed a Lhermitte sign in 2021 a week or two after the first Covid-19 vaccination [employer]) in March 2021.  She also had pain behind her eyes.    She had similar eye pain after the 2nd shot and after her booster later in 2021.  When she bends her head, she gets a buzzing sensation down her neck. These occur multiple times a day and just lasts a few seconds.   The sensation is buzzing not pain.   She feels it into her arms, trunks and proximal legs.  She went to orthopedics as she also had also had some mid and lower back pain.  She did PT but she continues to experience these symptoms.     In July 2021 she had an episode of left sided numbness and weakness that lasted a day .     She went to the ED and had an MRI showing 2 T2 hyperintense foci.     She was felt to have a complicated  migraine.     She also had a history of urinary incontinence and tingling in her legs in 2019.  She also had vertio leading to falls.   Since TPM had just been started she though it may have been related.    MRI     Vit D was 19.9.   Heavy metal test showed increase nickel ad tin   Aluminum was borderline high.   IMAGING: MRI of the brain from 09/14/2019.  It shows 2 T2/FLAIR hyperintense foci in the subcortical white matter of the left frontal lobe.  Neither these appear to be acute.  They do not enhance.  No periventricular or infratentorial foci were noted.  MRI cervical spie 09/06/2020 showed a 2-3 mm focus of T2 hyperintensity in the dorsal midline of the spinal cord at C2-3 without enhancement. She also had some DJD  MRI brain 10/03/2020 showed a stable appearance of focal subcortical T2 hyperintensity in the left frontal operculum and anterior left frontal lobe. Findings are consistent with the given diagnosis of multiple sclerosis.    By my review, 3 foci were not present on the 2021 MRI of the brain showing progression over time.    demyelination.  REVIEW OF SYSTEMS: Constitutional: No fevers, chills, sweats, or change in appetite Eyes: No visual changes, double vision, eye pain Ear, nose and throat: No hearing loss, ear pain, nasal congestion, sore throat Cardiovascular: No chest pain, palpitations Respiratory:  No shortness of breath at rest or with exertion.   No wheezes GastrointestinaI: No nausea, vomiting, diarrhea, abdominal pain, fecal incontinence Genitourinary:  No dysuria, urinary retention or frequency.  No nocturia. Musculoskeletal:  No neck pain, back pain Integumentary: No rash, pruritus, skin lesions Neurological: as above Psychiatric: No depression at this time.  No anxiety Endocrine: No palpitations, diaphoresis, change in appetite, change in weigh or increased thirst Hematologic/Lymphatic:  No anemia, purpura, petechiae. Allergic/Immunologic: No itchy/runny  eyes, nasal congestion, recent allergic reactions, rashes  ALLERGIES: Allergies  Allergen Reactions   Bee Venom Anaphylaxis    HOME MEDICATIONS:  Current Outpatient Medications:    albuterol (VENTOLIN HFA) 108 (90 Base) MCG/ACT inhaler, Inhale 1-2 puffs into the lungs every 4 (four) hours as needed for wheezing or shortness of breath., Disp: 1 each, Rfl: 0   Armodafinil 200 MG TABS, One po qAM, Disp: 30 tablet, Rfl: 5   EPINEPHrine (EPIPEN 2-PAK) 0.3 mg/0.3 mL IJ SOAJ injection, Inject 0.3 mg into the muscle once as needed for  up to 1 dose (for severe allergic reaction). CAll 911 immediately if you have to use this medicine, Disp: 1 each, Rfl: 1   Ferrous Fumarate (IRON) 18 MG TBCR, Take by mouth., Disp: , Rfl:    levonorgestrel (LILETTA, 52 MG,) 19.5 MCG/DAY IUD IUD, 1 each by Intrauterine route once., Disp: , Rfl:    Multiple Vitamin (MULTIVITAMIN ADULT) TABS, Take by mouth., Disp: , Rfl:    triamcinolone ointment (KENALOG) 0.5 %, Apply 1 application topically 2 (two) times daily., Disp: 30 g, Rfl: 0   Vitamin D, Ergocalciferol, (DRISDOL) 1.25 MG (50000 UNIT) CAPS capsule, Take 1 capsule (50,000 Units total) by mouth every 7 (seven) days., Disp: 13 capsule, Rfl: 3   zonisamide (ZONEGRAN) 100 MG capsule, Two po qHS, Disp: 60 capsule, Rfl: 11  PAST MEDICAL HISTORY: Past Medical History:  Diagnosis Date   Anemia    Asthma    Giant cell tumor    Hypersomnolence    Migraines    Sleep disorder     PAST SURGICAL HISTORY: Past Surgical History:  Procedure Laterality Date   MASS EXCISION Right 05/16/2019   Procedure: Excision right wrist volar mass;  Surgeon: Iran Planas, MD;  Location: [place];  Service: Orthopedics;  Laterality: Right;  with IV sedation   NASAL SEPTUM SURGERY     tissue removed from nose   REFRACTIVE SURGERY     ROTATOR CUFF REPAIR     TONSILLECTOMY      FAMILY HISTORY: Family History  Problem Relation Age of Onset   Diabetes Mother     Hypertension Father    Hyperlipidemia Father    Aortic dissection Father 59   Early death Maternal Grandmother    Aortic dissection Maternal Grandmother 34   Early death Maternal Grandfather    Heart attack Paternal Grandmother    Lupus Sister    Eczema Niece     SOCIAL HISTORY:  Social History   Socioeconomic History   Marital status: Single    Spouse name: Not on file   Number of children: 0   Years of education: 74   Highest education level: Not on file  Occupational History   Not on file  Tobacco Use   Smoking status: Never   Smokeless tobacco: Never  Vaping Use   Vaping Use: Never used  Substance and Sexual Activity   Alcohol use: Not Currently   Drug use: Never   Sexual activity: Not Currently    Birth control/protection: I.U.D.  Other Topics Concern   Not on file  Social History Narrative   Right handed   Caffeine use: 3 times per week (coffee, no soda, energy drink once per week)   Social Determinants of Health   Financial Resource Strain: Not on file  Food Insecurity: Not on file  Transportation Needs: Not on file  Physical Activity: Not on file  Stress: Not on file  Social Connections: Not on file  Intimate Partner Violence: Not on file     PHYSICAL EXAM  Vitals:   11/25/20 0817  BP: 125/83  Pulse: 88  Weight: 234 lb (106.1 kg)  Height: '5\' 5"'$  (1.651 m)    Body mass index is 38.94 kg/m.   General: The patient is well-developed and well-nourished and in no acute distress  HEENT:  Head is [place]/AT.  Sclera are anicteric.  Funduscopic exam shows normal optic discs and retinal vessels.  Neck: No carotid bruits are noted.  The neck is nontender.  Cardiovascular: The heart  has a regular rate and rhythm with a normal S1 and S2. There were no murmurs, gallops or rubs.    Skin: Extremities are without rash or  edema.  Musculoskeletal:  Back is nontender  Neurologic Exam  Mental status: The patient is alert and oriented x 3 at the time of the  examination. The patient has apparent normal recent and remote memory, with an apparently normal attention span and concentration ability.   Speech is normal.  Cranial nerves: Extraocular movements are full.  Pupils react equally.  Facial symmetry is present.  There is good facial sensation to soft touch bilaterally.Facial strength is normal.  Trapezius and sternocleidomastoid strength is normal. No dysarthria is noted.  The tongue is midline, and the patient has symmetric elevation of the soft palate. No obvious hearing deficits are noted.  Motor:  Muscle bulk is normal.   Tone is normal. Strength is  5 / 5 in all 4 extremities.   Sensory: Intact to touch and sensation. Coordination: Cerebellar testing reveals good finger-nose-finger and heel-to-shin bilaterally.  Gait and station: Station is normal.   Gait is normal.  Tandem gait is mildly wide.. Romberg is negative.   Reflexes: Deep tendon reflexes are symmetric and normal bilaterally.       DIAGNOSTIC DATA (LABS, IMAGING, TESTING) - I reviewed patient records, labs, notes, testing and imaging myself where available.  Lab Results  Component Value Date   WBC 9.7 06/13/2020   HGB 12.5 06/13/2020   HCT 38.7 06/13/2020   MCV 84.1 06/13/2020   PLT 410 (H) 06/13/2020      Component Value Date/Time   NA 138 06/13/2020 2024   K 3.8 06/13/2020 2024   CL 105 06/13/2020 2024   CO2 23 06/13/2020 2024   GLUCOSE 99 06/13/2020 2024   BUN 17 06/13/2020 2024   CREATININE 0.70 06/13/2020 2024   CREATININE 0.73 09/25/2019 0832   CALCIUM 9.3 06/13/2020 2024   PROT 7.4 10/16/2020 0928   ALBUMIN 4.3 10/16/2020 0928   AST 13 10/16/2020 0928   ALT 10 10/16/2020 0928   ALKPHOS 101 10/16/2020 0928   BILITOT 0.5 10/16/2020 0928   GFRNONAA >60 06/13/2020 2024   GFRAA >60 09/14/2019 2154   Lab Results  Component Value Date   CHOL 203 (H) 10/23/2020   HDL 46.30 10/23/2020   LDLCALC 142 (H) 10/23/2020   TRIG 73.0 10/23/2020   CHOLHDL 4  10/23/2020   Lab Results  Component Value Date   HGBA1C 5.2 09/25/2019   Lab Results  Component Value Date   [id number] 09/25/2019   Lab Results  Component Value Date   TSH 1.25 07/07/2020       ASSESSMENT AND PLAN  Multiple sclerosis ([place])  Hypersomnia with sleep apnea  High risk medication use  Lhermitte's sign positive  Urge incontinence  Numbness   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  Post multi sleep latency mean was 2.3 minutes.  She did not have any REM sleep during the MSLT.  She also has mild OSA.  We discussed getting the pure sleep oral appliance and referring to dentistry for a better oral appliance if she feels she gets a benefit.   Vitamin D was low and we will supplement. Her MS is stable at this time.  We are trying to get her started on Tysabri as she has a more aggressive than typical course.   She  will return in 4 months or sooner if there are new or worsening neurologic symptoms.Marland Kitchen  45-minute office visit with the majority of the time spent face-to-face for history and physical, discussion/counseling and decision-making.  Additional time with record review and documentation.   Adriano Bischof A. Felecia Shelling, MD, [facility] [id number], [id number] PM Certified in Neurology, Clinical Neurophysiology, Sleep Medicine and Neuroimaging  [facility] [address] [place], [place] [address] [phone]

## 2020-12-02 ENCOUNTER — Other Ambulatory Visit: Payer: Self-pay | Admitting: *Deleted

## 2020-12-02 MED ORDER — ARMODAFINIL 200 MG PO TABS: ORAL_TABLET | ORAL | 5 refills | Status: DC

## 2020-12-08 NOTE — Telephone Encounter (Signed)
Patient's first Tysabri infusion was 12/02/2020.

## 2021-02-17 ENCOUNTER — Ambulatory Visit (INDEPENDENT_AMBULATORY_CARE_PROVIDER_SITE_OTHER): Payer: 59 | Admitting: Neurology

## 2021-02-17 ENCOUNTER — Telehealth: Payer: Self-pay | Admitting: *Deleted

## 2021-02-17 ENCOUNTER — Other Ambulatory Visit: Payer: Self-pay

## 2021-02-17 ENCOUNTER — Encounter: Payer: Self-pay | Admitting: Neurology

## 2021-02-17 VITALS — BP 138/88 | HR 91 | Ht 65.0 in | Wt 215.5 lb

## 2021-02-17 DIAGNOSIS — G35 Multiple sclerosis: Secondary | ICD-10-CM | POA: Diagnosis not present

## 2021-02-17 DIAGNOSIS — R29818 Other symptoms and signs involving the nervous system: Secondary | ICD-10-CM

## 2021-02-17 DIAGNOSIS — Z79899 Other long term (current) drug therapy: Secondary | ICD-10-CM

## 2021-02-17 DIAGNOSIS — G4710 Hypersomnia, unspecified: Secondary | ICD-10-CM

## 2021-02-17 DIAGNOSIS — G4730 Sleep apnea, unspecified: Secondary | ICD-10-CM

## 2021-02-17 DIAGNOSIS — G43709 Chronic migraine without aura, not intractable, without status migrainosus: Secondary | ICD-10-CM

## 2021-02-17 DIAGNOSIS — R200 Anesthesia of skin: Secondary | ICD-10-CM

## 2021-02-17 NOTE — Progress Notes (Signed)
[facility]  PATIENT: Kaylee Cowan DOB: [birth year]  REFERRING DOCTOR OR PCP:  Micheline Rough, MD SOURCE: patient, notes from Neurology, PCP and ED, imaging and lab reports, MRI images personally reviewed.  _________________________________   HISTORICAL  CHIEF COMPLAINT:  Chief Complaint  Patient presents with   Follow-up    RM 1, alone. Last seen 10/16/20. MS-On Tysabri. Last Tysabri 12/30/20. Missed November. Plans to r/s.    HISTORY OF PRESENT ILLNESS:  Kaylee Cowan is a 40 y.o. 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  She is in the process of getting started on Tysabri..   She has had little hives after some infusions but an antihistamine quickly clears it up    She has lot of environmental allergies.     She has a JCV low positive 0.52.       She has not had any exaceraions in last month   She was diagnosed with idiopathic hypersomnia at [facility] (Dr. York Pellant).  She had a PSG/MSLT I 2019 and reportedly had no OSA but had a short sleep latency.     She has spells where she gets emotional and gets sleepy and falls asleep.  She has vivid dreams but does not have sleep paralysis.   She no longer naps.  Short naps had not helped.     She sleep 7-8 hours at night.   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  Armodafinil has helped.  It helps as much as Ritalin with better tolerability.    MSLT in 2019 did not show narcolepsy but she had a trace of stimulant on the tox screen (was off for several days though).   There was no REM sleep but Mean sleep latency of 2.3 monutes.     She had gained weight in 2020 and a HST showed mild OSA.   She has lost 20 pounds since then.      Medical issues include HTN and migraine.   Zonisamide helps migraines bur was not tolerated well so she went back on topiramate.    EPWORTH SLEEPINESS SCALE  On  a scale of 0 - 3 what is the chance of dozing:  Sitting and Reading:   0 Watching TV:    1 Sitting inactive in a public place: 2 Passenger in car for one hour: 2 Lying down to rest in the afternoon: 1 Sitting and talking to someone: 1 Sitting quietly after lunch:  1 In a car, stopped in traffic:  0  Total (out of 24):    8/24   (no excessive sleepiness).    History of MS: She developed a Lhermitte sign in 2021 a week or two after the first Covid-19 vaccination [employer]) in March 2021.  She also had pain behind her eyes.    She had similar eye pain after the 2nd shot and after her booster later in 2021.  When she bends her head, she gets a buzzing sensation down her neck. These occur multiple times a day and just lasts a few seconds.   The sensation is buzzing not pain.   She feels it into her arms, trunks and proximal legs.  She went to orthopedics as she also had also had some mid and lower back pain.  She did PT but she continues to experience these symptoms.     In July 2021 she  had an episode of left sided numbness and weakness that lasted a day .     She went to the ED and had an MRI showing 2 T2 hyperintense foci.     She was felt to have a complicated migraine.     Her MRI of the brain 10/03/2020 that showed 7 T2/FLAIR hyperintense foci in the hemispheres including 2 in the left splenium that were radially oriented to the ventricles, 2 juxtacortical foci and 3 deep white matter foci.  None of the foci enhance.  At least 3 of these foci currently on the present MRI were not present on the MRI from 2021 consistent with progression and satisfying the dissemination in time criteria for MS.  Additionally, she has a focus within her cervical spine likely the source of the Lhermitte sign.     She also had a history of urinary incontinence and tingling in her legs in 2019.  She also had vertio leading to falls.   Since TPM had just been started she though it may have been related.    MRI      Vit D was 19.9.   Heavy metal test showed increase nickel ad tin   Aluminum was borderline high.   IMAGING: MRI of the brain from 09/14/2019.  It shows 2 T2/FLAIR hyperintense foci in the subcortical white matter of the left frontal lobe.  Neither these appear to be acute.  They do not enhance.  No periventricular or infratentorial foci were noted.  MRI cervical spie 09/06/2020 showed a 2-3 mm focus of T2 hyperintensity in the dorsal midline of the spinal cord at C2-3 without enhancement. She also had some DJD  MRI brain 10/03/2020 showed a stable appearance of focal subcortical T2 hyperintensity in the left frontal operculum and anterior left frontal lobe. Findings are consistent with the given diagnosis of multiple sclerosis.    By my review, 3 foci were not present on the 2021 MRI of the brain showing progression over time.    demyelination.  REVIEW OF SYSTEMS: Constitutional: No fevers, chills, sweats, or change in appetite Eyes: No visual changes, double vision, eye pain Ear, nose and throat: No hearing loss, ear pain, nasal congestion, sore throat Cardiovascular: No chest pain, palpitations Respiratory:  No shortness of breath at rest or with exertion.   No wheezes GastrointestinaI: No nausea, vomiting, diarrhea, abdominal pain, fecal incontinence Genitourinary:  No dysuria, urinary retention or frequency.  No nocturia. Musculoskeletal:  No neck pain, back pain Integumentary: No rash, pruritus, skin lesions Neurological: as above Psychiatric: No depression at this time.  No anxiety Endocrine: No palpitations, diaphoresis, change in appetite, change in weigh or increased thirst Hematologic/Lymphatic:  No anemia, purpura, petechiae. Allergic/Immunologic: No itchy/runny eyes, nasal congestion, recent allergic reactions, rashes  ALLERGIES: Allergies  Allergen Reactions   Bee Venom Anaphylaxis    HOME MEDICATIONS:  Current Outpatient Medications:    albuterol (VENTOLIN HFA) 108  (90 Base) MCG/ACT inhaler, Inhale 1-2 puffs into the lungs every 4 (four) hours as needed for wheezing or shortness of breath., Disp: 1 each, Rfl: 0   Armodafinil 200 MG TABS, One po qAM, Disp: 30 tablet, Rfl: 5   EPINEPHrine (EPIPEN 2-PAK) 0.3 mg/0.3 mL IJ SOAJ injection, Inject 0.3 mg into the muscle once as needed for up to 1 dose (for severe allergic reaction). CAll 911 immediately if you have to use this medicine, Disp: 1 each, Rfl: 1   Ferrous Fumarate (IRON) 18 MG TBCR, Take by mouth., Disp: ,  Rfl:    levonorgestrel (LILETTA, 52 MG,) 19.5 MCG/DAY IUD IUD, 1 each by Intrauterine route once., Disp: , Rfl:    Multiple Vitamin (MULTIVITAMIN ADULT) TABS, Take by mouth., Disp: , Rfl:    topiramate (TOPAMAX) 50 MG tablet, Take 50-100 mg by mouth at bedtime., Disp: , Rfl:    triamcinolone ointment (KENALOG) 0.5 %, Apply 1 application topically 2 (two) times daily., Disp: 30 g, Rfl: 0   Vitamin D, Ergocalciferol, (DRISDOL) 1.25 MG (50000 UNIT) CAPS capsule, Take 1 capsule (50,000 Units total) by mouth every 7 (seven) days., Disp: 13 capsule, Rfl: 3  PAST MEDICAL HISTORY: Past Medical History:  Diagnosis Date   Anemia    Asthma    Giant cell tumor    Hypersomnolence    Migraines    Sleep disorder     PAST SURGICAL HISTORY: Past Surgical History:  Procedure Laterality Date   MASS EXCISION Right 05/16/2019   Procedure: Excision right wrist volar mass;  Surgeon: Iran Planas, MD;  Location: [facility];  Service: Orthopedics;  Laterality: Right;  with IV sedation   NASAL SEPTUM SURGERY     tissue removed from nose   REFRACTIVE SURGERY     ROTATOR CUFF REPAIR     TONSILLECTOMY      FAMILY HISTORY: Family History  Problem Relation Age of Onset   Diabetes Mother    Hypertension Father    Hyperlipidemia Father    Aortic dissection Father 40   Early death Maternal Grandmother    Aortic dissection Maternal Grandmother 34   Early death Maternal Grandfather    Heart attack  Paternal Grandmother    Lupus Sister    Eczema Niece     SOCIAL HISTORY:  Social History   Socioeconomic History   Marital status: Single    Spouse name: Not on file   Number of children: 0   Years of education: 29   Highest education level: Not on file  Occupational History   Not on file  Tobacco Use   Smoking status: Never   Smokeless tobacco: Never  Vaping Use   Vaping Use: Never used  Substance and Sexual Activity   Alcohol use: Not Currently   Drug use: Never   Sexual activity: Not Currently    Birth control/protection: I.U.D.  Other Topics Concern   Not on file  Social History Narrative   Right handed   Caffeine use: 3 times per week (coffee, no soda, energy drink once per week)   Social Determinants of Health   Financial Resource Strain: Not on file  Food Insecurity: Not on file  Transportation Needs: Not on file  Physical Activity: Not on file  Stress: Not on file  Social Connections: Not on file  Intimate Partner Violence: Not on file     PHYSICAL EXAM  Vitals:   02/17/21 0813  BP: 138/88  Pulse: 91  Weight: 215 lb 8 oz (97.8 kg)  Height: 5\' 5"  (1.651 m)    Body mass index is 35.86 kg/m.   General: The patient is well-developed and well-nourished and in no acute distress  HEENT:  Head is [place]/AT.  Sclera are anicteric.  Funduscopic exam shows normal optic discs and retinal vessels.  Neck: No carotid bruits are noted.  The neck is nontender.  Cardiovascular: The heart has a regular rate and rhythm with a normal S1 and S2. There were no murmurs, gallops or rubs.    Skin: Extremities are without rash or  edema.  Musculoskeletal:  Back is nontender  Neurologic Exam  Mental status: The patient is alert and oriented x 3 at the time of the examination. The patient has apparent normal recent and remote memory, with an apparently normal attention span and concentration ability.   Speech is normal.  Cranial nerves: Extraocular movements are  full.  Pupils react equally.  Facial symmetry is present.  There is good facial sensation to soft touch bilaterally.Facial strength is normal.  Trapezius and sternocleidomastoid strength is normal. No dysarthria is noted.  Symmetric hearing.  Motor:  Muscle bulk is normal.   Tone is normal. Strength is  5 / 5 in all 4 extremities.   Sensory: Intact to touch and sensation in the arms and legs.  Coordination: Cerebellar testing reveals good finger-nose-finger and heel-to-shin bilaterally.  Gait and station: Station is normal.   Gait is normal.  Tandem gait was mildly wide.  Romberg is negative.   Reflexes: Deep tendon reflexes are symmetric and normal bilaterally.       DIAGNOSTIC DATA (LABS, IMAGING, TESTING) - I reviewed patient records, labs, notes, testing and imaging myself where available.  Lab Results  Component Value Date   WBC 9.7 06/13/2020   HGB 12.5 06/13/2020   HCT 38.7 06/13/2020   MCV 84.1 06/13/2020   PLT 410 (H) 06/13/2020      Component Value Date/Time   NA 138 06/13/2020 2024   K 3.8 06/13/2020 2024   CL 105 06/13/2020 2024   CO2 23 06/13/2020 2024   GLUCOSE 99 06/13/2020 2024   BUN 17 06/13/2020 2024   CREATININE 0.70 06/13/2020 2024   CREATININE 0.73 09/25/2019 0832   CALCIUM 9.3 06/13/2020 2024   PROT 7.4 10/16/2020 0928   ALBUMIN 4.3 10/16/2020 0928   AST 13 10/16/2020 0928   ALT 10 10/16/2020 0928   ALKPHOS 101 10/16/2020 0928   BILITOT 0.5 10/16/2020 0928   GFRNONAA >60 06/13/2020 2024   GFRAA >60 09/14/2019 2154   Lab Results  Component Value Date   CHOL 203 (H) 10/23/2020   HDL 46.30 10/23/2020   LDLCALC 142 (H) 10/23/2020   TRIG 73.0 10/23/2020   CHOLHDL 4 10/23/2020   Lab Results  Component Value Date   HGBA1C 5.2 09/25/2019   Lab Results  Component Value Date   ZOXWRUEA54 098 09/25/2019   Lab Results  Component Value Date   TSH 1.25 07/07/2020       ASSESSMENT AND PLAN  Multiple sclerosis ([place]) - Plan: Stratify JCV  Antibody Test ([facility]), CBC with Differential/Platelet  High risk medication use - Plan: Stratify JCV Antibody Test ([facility]), CBC with Differential/Platelet  Lhermitte's sign positive  Hypersomnia with sleep apnea  Chronic migraine without aura without status migrainosus, not intractable  Numbness   Continue Tysabri for MS.  We will check JCV antibody and CBC today 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  Post multi sleep latency mean was 2.3 minutes.  She did not have any REM sleep during the MSLT.  She also has mild OSA.  We discussed getting the pure sleep oral appliance and referring to dentistry for a better oral appliance if she feels she gets a benefit.   Vitamin D was low and we will supplement. She will return in 6 months or sooner if there are new or worsening neurologic symptoms.Nanine Means. Felecia Shelling, MD, [facility] 01/21/1477, 2:95 AM Certified in Neurology, Clinical Neurophysiology, Sleep  Medicine and Neuroimaging  [facility] [address] [place], [place] [address] [phone]

## 2021-02-17 NOTE — Telephone Encounter (Signed)
Placed JCV lab in [facility] lock box for routine lab pick up. Results pending. 

## 2021-02-18 LAB — CBC WITH DIFFERENTIAL/PLATELET
Basophils Absolute: 0.1 10*3/uL (ref 0.0–0.2)
Basos: 1 %
EOS (ABSOLUTE): 0.2 10*3/uL (ref 0.0–0.4)
Eos: 2 %
Hematocrit: 36.9 % (ref 34.0–46.6)
Hemoglobin: 12.1 g/dL (ref 11.1–15.9)
Immature Grans (Abs): 0.1 10*3/uL (ref 0.0–0.1)
Immature Granulocytes: 1 %
Lymphocytes Absolute: 3.9 10*3/uL — ABNORMAL HIGH (ref 0.7–3.1)
Lymphs: 40 %
MCH: 27.5 pg (ref 26.6–33.0)
MCHC: 32.8 g/dL (ref 31.5–35.7)
MCV: 84 fL (ref 79–97)
Monocytes Absolute: 0.6 10*3/uL (ref 0.1–0.9)
Monocytes: 7 %
Neutrophils Absolute: 4.8 10*3/uL (ref 1.4–7.0)
Neutrophils: 49 %
Platelets: 369 10*3/uL (ref 150–450)
RBC: 4.4 x10E6/uL (ref 3.77–5.28)
RDW: 13.1 % (ref 11.7–15.4)
WBC: 9.6 10*3/uL (ref 3.4–10.8)

## 2021-02-23 NOTE — Telephone Encounter (Signed)
JCV ab drawn 02/17/21 positive, index: 0.69. Gave to MD for review.

## 2021-03-31 DIAGNOSIS — G35 Multiple sclerosis: Secondary | ICD-10-CM | POA: Diagnosis not present

## 2021-04-28 DIAGNOSIS — G35 Multiple sclerosis: Secondary | ICD-10-CM | POA: Diagnosis not present

## 2021-05-05 ENCOUNTER — Ambulatory Visit: Payer: BC Managed Care – PPO | Admitting: Cardiology

## 2021-05-05 ENCOUNTER — Other Ambulatory Visit: Payer: Self-pay

## 2021-05-05 ENCOUNTER — Encounter: Payer: Self-pay | Admitting: Cardiology

## 2021-05-05 VITALS — BP 120/90 | HR 76 | Ht 65.0 in | Wt 224.1 lb

## 2021-05-05 DIAGNOSIS — R030 Elevated blood-pressure reading, without diagnosis of hypertension: Secondary | ICD-10-CM | POA: Diagnosis not present

## 2021-05-05 DIAGNOSIS — Z6837 Body mass index (BMI) 37.0-37.9, adult: Secondary | ICD-10-CM | POA: Diagnosis not present

## 2021-05-05 DIAGNOSIS — I493 Ventricular premature depolarization: Secondary | ICD-10-CM

## 2021-05-05 NOTE — Patient Instructions (Signed)
Medication Instructions:  Your physician recommends that you continue on your current medications as directed. Please refer to the Current Medication list given to you today.  *If you need a refill on your cardiac medications before your next appointment, please call your pharmacy*   Lab Work: None ordered If you have labs (blood work) drawn today and your tests are completely normal, you will receive your results only by: [place] (if you have MyChart) OR A paper copy in the mail If you have any lab test that is abnormal or we need to change your treatment, we will call you to review the results.   Testing/Procedures: None ordered   Follow-Up: At [facility], you and your health needs are our priority.  As part of our continuing mission to provide you with exceptional heart care, we have created designated Provider Care Teams.  These Care Teams include your primary Cardiologist (physician) and Advanced Practice Providers (APPs -  Physician Assistants and Nurse Practitioners) who all work together to provide you with the care you need, when you need it.  We recommend signing up for the patient portal called "MyChart".  Sign up information is provided on this After Visit Summary.  MyChart is used to connect with patients for Virtual Visits (Telemedicine).  Patients are able to view lab/test results, encounter notes, upcoming appointments, etc.  Non-urgent messages can be sent to your provider as well.   To learn more about what you can do with MyChart, go to [email or web address].    Your next appointment:   Follow up as needed   The format for your next appointment:   In Person  Provider:   You may see Remonia Richter MD or one of the following Advanced Practice Providers on your designated Care Team:   Murray Hodgkins, NP Christell Faith, PA-C Cadence Kathlen Mody, [place]    Other Instructions

## 2021-05-05 NOTE — Progress Notes (Signed)
Cardiology Office Note:    Date:  05/05/2021   ID:  Kaylee Cowan, DOB 07/24/1980, MRN [id number]  PCP:  Kaylee Macadam, MD   [facility] Providers Cardiologist:  None     Referring MD: Kaylee Macadam, MD   Chief Complaint  Patient presents with   Other    6 month f/u pt would like to discuss BP spikes. Meds reviewed verbally with pt.    History of Present Illness:    Kaylee Cowan is a 41 y.o. female with a hx of asthma, obesity, MS who presents due to occasional elevated BP .  Patient gets infusion therapy once a month for multiple sclerosis treatment.  She has noticed elevated blood pressures typically when she gets her infusion treatments.  States blood pressures could be elevated as high as 170s, and after her infusion treatment she is monitored for couple of minutes with improvement to the 140s.  She states BP typically gets elevated only around infusion treatments.  She otherwise has normal blood pressures at other times.  Was previously seen for occasional irregular heartbeats, diagnosed with very rare PVCs.  Symptoms overall have resolved.  She feels well, exercising and eating low calorie diet to help with weight loss.  Prior notes Echocardiogram 05/5595 normal systolic and diastolic function, EF 60 to 65%.  Past Medical History:  Diagnosis Date   Anemia    Asthma    Giant cell tumor    Hypersomnolence    Migraines    Sleep disorder     Past Surgical History:  Procedure Laterality Date   MASS EXCISION Right 05/16/2019   Procedure: Excision right wrist volar mass;  Surgeon: Iran Planas, MD;  Location: [place];  Service: Orthopedics;  Laterality: Right;  with IV sedation   NASAL SEPTUM SURGERY     tissue removed from nose   REFRACTIVE SURGERY     ROTATOR CUFF REPAIR     TONSILLECTOMY      Current Medications: Current Meds  Medication Sig   albuterol (VENTOLIN HFA) 108 (90 Base) MCG/ACT inhaler Inhale 1-2  puffs into the lungs every 4 (four) hours as needed for wheezing or shortness of breath.   Armodafinil 200 MG TABS One po qAM   EPINEPHrine (EPIPEN 2-PAK) 0.3 mg/0.3 mL IJ SOAJ injection Inject 0.3 mg into the muscle once as needed for up to 1 dose (for severe allergic reaction). CAll 911 immediately if you have to use this medicine   Ferrous Fumarate (IRON) 18 MG TBCR Take by mouth.   levonorgestrel (LILETTA, 52 MG,) 19.5 MCG/DAY IUD IUD 1 each by Intrauterine route once.   Multiple Vitamin (MULTIVITAMIN ADULT) TABS Take by mouth.   topiramate (TOPAMAX) 50 MG tablet Take 50-100 mg by mouth at bedtime.   triamcinolone ointment (KENALOG) 0.5 % Apply 1 application topically 2 (two) times daily.   Vitamin D, Ergocalciferol, (DRISDOL) 1.25 MG (50000 UNIT) CAPS capsule Take 1 capsule (50,000 Units total) by mouth every 7 (seven) days.     Allergies:   Bee venom   Social History   Socioeconomic History   Marital status: Single    Spouse name: Not on file   Number of children: 0   Years of education: 42   Highest education level: Not on file  Occupational History   Not on file  Tobacco Use   Smoking status: Never   Smokeless tobacco: Never  Vaping Use   Vaping Use: Never used  Substance  and Sexual Activity   Alcohol use: Not Currently   Drug use: Never   Sexual activity: Not Currently    Birth control/protection: I.U.D.  Other Topics Concern   Not on file  Social History Narrative   Right handed   Caffeine use: 3 times per week (coffee, no soda, energy drink once per week)   Social Determinants of Health   Financial Resource Strain: Not on file  Food Insecurity: Not on file  Transportation Needs: Not on file  Physical Activity: Not on file  Stress: Not on file  Social Connections: Not on file     Family History: The patient's family history includes Aortic dissection (age of onset: 45) in her father and maternal grandmother; Diabetes in her mother; Early death in her  maternal grandfather and maternal grandmother; Eczema in her niece; Heart attack in her paternal grandmother; Hyperlipidemia in her father; Hypertension in her father; Lupus in her sister.  ROS:   Please see the history of present illness.     All other systems reviewed and are negative.  EKGs/Labs/Other Studies Reviewed:    The following studies were reviewed today:   EKG:  EKG is  ordered today.  The ekg ordered today demonstrates normal sinus rhythm, normal ECG  Recent Labs: 06/13/2020: BUN 17; Creatinine, Ser 0.70; Magnesium 1.8; Potassium 3.8; Sodium 138 07/07/2020: TSH 1.25 10/16/2020: ALT 10 02/17/2021: Hemoglobin 12.1; Platelets 369  Recent Lipid Panel    Component Value Date/Time   CHOL 203 (H) 10/23/2020 0739   TRIG 73.0 10/23/2020 0739   HDL 46.30 10/23/2020 0739   CHOLHDL 4 10/23/2020 0739   VLDL 14.6 10/23/2020 0739   LDLCALC 142 (H) 10/23/2020 0739   LDLCALC 113 (H) 09/25/2019 0832     Risk Assessment/Calculations:      Physical Exam:    VS:  BP 120/90 (BP Location: Left Arm, Patient Position: Sitting, Cuff Size: Large)    Pulse 76    Ht 5\' 5"  (1.651 m)    Wt 224 lb 2 oz (101.7 kg)    SpO2 98%    BMI 37.30 kg/m     Wt Readings from Last 3 Encounters:  05/05/21 224 lb 2 oz (101.7 kg)  02/17/21 215 lb 8 oz (97.8 kg)  11/25/20 234 lb (106.1 kg)     GEN:  Well nourished, well developed in no acute distress HEENT: Normal NECK: No JVD; No carotid bruits CARDIAC: RRR, no murmurs, rubs, gallops RESPIRATORY:  Clear to auscultation without rales, wheezing or rhonchi  ABDOMEN: Soft, non-tender, non-distended MUSCULOSKELETAL:  No edema; No deformity  SKIN: Warm and dry NEUROLOGIC:  Alert and oriented x 3 PSYCHIATRIC:  Normal affect   ASSESSMENT:    1. PVC (premature ventricular contraction)   2. BMI 37.0-37.9, adult   3. Elevated BP without diagnosis of hypertension      PLAN:    In order of problems listed above:  Irregular heartbeat, prior cardiac  monitor showed occasional PVCs,no other significant arrhythmias.  PVC burden 1.1%.  Echo 09/2019  EF 60 to 65%.  Improved symptoms.  Continue to monitor.  No need for beta-blockers at this time. Obesity, low-calorie diet weight loss recommended. Elevated BP during infusion therapy.  Likely secondary to anxiety/stress in the setting of therapy.  Blood pressures at other times have been normal.  Low-salt diet, exercise advised.  No indication for BP medications at this time.  Follow-up as needed     Medication Adjustments/Labs and Tests Ordered: Current medicines are reviewed  at length with the patient today.  Concerns regarding medicines are outlined above.  No orders of the defined types were placed in this encounter.   No orders of the defined types were placed in this encounter.    Patient Instructions  Medication Instructions:  Your physician recommends that you continue on your current medications as directed. Please refer to the Current Medication list given to you today.  *If you need a refill on your cardiac medications before your next appointment, please call your pharmacy*   Lab Work: None ordered If you have labs (blood work) drawn today and your tests are completely normal, you will receive your results only by: [place] (if you have MyChart) OR A paper copy in the mail If you have any lab test that is abnormal or we need to change your treatment, we will call you to review the results.   Testing/Procedures: None ordered   Follow-Up: At [facility], you and your health needs are our priority.  As part of our continuing mission to provide you with exceptional heart care, we have created designated Provider Care Teams.  These Care Teams include your primary Cardiologist (physician) and Advanced Practice Providers (APPs -  Physician Assistants and Nurse Practitioners) who all work together to provide you with the care you need, when you need it.  We recommend  signing up for the patient portal called "MyChart".  Sign up information is provided on this After Visit Summary.  MyChart is used to connect with patients for Virtual Visits (Telemedicine).  Patients are able to view lab/test results, encounter notes, upcoming appointments, etc.  Non-urgent messages can be sent to your provider as well.   To learn more about what you can do with MyChart, go to [email or web address].    Your next appointment:   Follow up as needed   The format for your next appointment:   In Person  Provider:   You may see Remonia Richter MD or one of the following Advanced Practice Providers on your designated Care Team:   Murray Hodgkins, NP Christell Faith, PA-C Cadence Kathlen Mody, [place]    Other Instructions     Signed, Kate Sable, MD  05/05/2021 10:25 AM    [place]

## 2021-05-07 NOTE — Addendum Note (Signed)
Addended by: Britt Bottom on: 05/07/2021 04:49 PM   Modules accepted: Orders

## 2021-05-12 ENCOUNTER — Telehealth: Payer: Self-pay | Admitting: *Deleted

## 2021-05-12 NOTE — Telephone Encounter (Signed)
Submitted PA armodafinil on CMM. Key: [id number]. Waiting on determination from [facility].

## 2021-05-13 ENCOUNTER — Other Ambulatory Visit: Payer: Self-pay | Admitting: *Deleted

## 2021-05-13 MED ORDER — ARMODAFINIL 200 MG PO TABS: ORAL_TABLET | ORAL | 5 refills | Status: DC

## 2021-05-26 ENCOUNTER — Other Ambulatory Visit: Payer: Self-pay | Admitting: *Deleted

## 2021-05-26 ENCOUNTER — Other Ambulatory Visit (INDEPENDENT_AMBULATORY_CARE_PROVIDER_SITE_OTHER): Payer: Self-pay

## 2021-05-26 ENCOUNTER — Telehealth: Payer: Self-pay | Admitting: *Deleted

## 2021-05-26 DIAGNOSIS — G35 Multiple sclerosis: Secondary | ICD-10-CM | POA: Diagnosis not present

## 2021-05-26 DIAGNOSIS — Z79899 Other long term (current) drug therapy: Secondary | ICD-10-CM | POA: Diagnosis not present

## 2021-05-26 DIAGNOSIS — Z0289 Encounter for other administrative examinations: Secondary | ICD-10-CM

## 2021-05-26 NOTE — Telephone Encounter (Signed)
Placed JCV lab in [facility] lock box for routine lab pick up. Results pending. 

## 2021-05-27 ENCOUNTER — Ambulatory Visit: Payer: BC Managed Care – PPO | Admitting: Family Medicine

## 2021-05-27 ENCOUNTER — Encounter: Payer: Self-pay | Admitting: Family Medicine

## 2021-05-27 VITALS — BP 118/82 | HR 80 | Temp 98.1°F | Ht 65.0 in | Wt 223.9 lb

## 2021-05-27 DIAGNOSIS — M25531 Pain in right wrist: Secondary | ICD-10-CM | POA: Diagnosis not present

## 2021-05-27 DIAGNOSIS — Z1231 Encounter for screening mammogram for malignant neoplasm of breast: Secondary | ICD-10-CM | POA: Diagnosis not present

## 2021-05-27 DIAGNOSIS — L819 Disorder of pigmentation, unspecified: Secondary | ICD-10-CM

## 2021-05-27 DIAGNOSIS — M6740 Ganglion, unspecified site: Secondary | ICD-10-CM | POA: Diagnosis not present

## 2021-05-27 DIAGNOSIS — G35 Multiple sclerosis: Secondary | ICD-10-CM | POA: Diagnosis not present

## 2021-05-27 LAB — CBC WITH DIFFERENTIAL/PLATELET
Basophils Absolute: 0 10*3/uL (ref 0.0–0.2)
Basos: 0 %
EOS (ABSOLUTE): 0.2 10*3/uL (ref 0.0–0.4)
Eos: 2 %
Hematocrit: 37.9 % (ref 34.0–46.6)
Hemoglobin: 12.1 g/dL (ref 11.1–15.9)
Immature Grans (Abs): 0 10*3/uL (ref 0.0–0.1)
Immature Granulocytes: 0 %
Lymphocytes Absolute: 4.3 10*3/uL — ABNORMAL HIGH (ref 0.7–3.1)
Lymphs: 45 %
MCH: 25.9 pg — ABNORMAL LOW (ref 26.6–33.0)
MCHC: 31.9 g/dL (ref 31.5–35.7)
MCV: 81 fL (ref 79–97)
Monocytes Absolute: 0.6 10*3/uL (ref 0.1–0.9)
Monocytes: 6 %
Neutrophils Absolute: 4.4 10*3/uL (ref 1.4–7.0)
Neutrophils: 47 %
Platelets: 374 10*3/uL (ref 150–450)
RBC: 4.68 x10E6/uL (ref 3.77–5.28)
RDW: 13.6 % (ref 11.7–15.4)
WBC: 9.6 10*3/uL (ref 3.4–10.8)

## 2021-05-27 NOTE — Progress Notes (Signed)
?Kaylee Cowan ?DOB: Jan 09, 1981 ?Encounter date: 05/27/2021 ? ?This is a 41 y.o. female who presents with ?Chief Complaint  ?Patient presents with  ? Cyst  ? Wrist Pain  ?  Patient complains of recurrent painful cyst noted along the right wrist x2 months, states she had an excision by a provider in [place] 2021  ? ? ?History of present illness: ?Cyst in wrist was removed, but then just kept growing. Hand is getting tired, throbbing with work, typing. Now it is popping a lot. Feels that there is some swelling where cyst was; just like how it was in beginning (cyst was seen on imaging (MRI) to fully see it before) ? ?Has been getting small spots of loss of pigment. Random.noted on forearms, legs for most part. Wondered if related to tysabri. No itching or irritation. Just increase in little white spots.  ? ?Gets random pains/sx that come and go. Hip pain, trouble with standing, but then goes away. Does feel like the tysabri has helped with numbness/tingling.  ? ? ?Allergies  ?Allergen Reactions  ? Bee Venom Anaphylaxis  ? ?Current Meds  ?Medication Sig  ? albuterol (VENTOLIN HFA) 108 (90 Base) MCG/ACT inhaler Inhale 1-2 puffs into the lungs every 4 (four) hours as needed for wheezing or shortness of breath.  ? Armodafinil 200 MG TABS One po qAM  ? EPINEPHrine (EPIPEN 2-PAK) 0.3 mg/0.3 mL IJ SOAJ injection Inject 0.3 mg into the muscle once as needed for up to 1 dose (for severe allergic reaction). CAll 911 immediately if you have to use this medicine  ? Ferrous Fumarate (IRON) 18 MG TBCR Take by mouth.  ? levonorgestrel (LILETTA, 52 MG,) 19.5 MCG/DAY IUD IUD 1 each by Intrauterine route once.  ? Multiple Vitamin (MULTIVITAMIN ADULT) TABS Take by mouth.  ? topiramate (TOPAMAX) 50 MG tablet Take 50-100 mg by mouth at bedtime.  ? triamcinolone ointment (KENALOG) 0.5 % Apply 1 application topically 2 (two) times daily.  ? ? ?Review of Systems  ?Constitutional:  Negative for chills, fatigue and fever.   ?Respiratory:  Negative for cough, chest tightness, shortness of breath and wheezing.   ?Cardiovascular:  Negative for chest pain, palpitations and leg swelling.  ? ?Objective: ? ?BP 118/82 (BP Location: Left Arm, Patient Position: Sitting, Cuff Size: Large)   Pulse 80   Temp 98.1 ?F (36.7 ?C) (Oral)   Ht '5\' 5"'$  (1.651 m)   Wt 223 lb 14.4 oz (101.6 kg)   SpO2 98%   BMI 37.26 kg/m?   Weight: 223 lb 14.4 oz (101.6 kg)  ? ?BP Readings from Last 3 Encounters:  ?05/27/21 118/82  ?05/05/21 120/90  ?02/17/21 138/88  ? ?Wt Readings from Last 3 Encounters:  ?05/27/21 223 lb 14.4 oz (101.6 kg)  ?05/05/21 224 lb 2 oz (101.7 kg)  ?02/17/21 215 lb 8 oz (97.8 kg)  ? ? ?Physical Exam ?Constitutional:   ?   General: She is not in acute distress. ?   Appearance: She is well-developed.  ?Cardiovascular:  ?   Rate and Rhythm: Normal rate and regular rhythm.  ?   Heart sounds: Normal heart sounds. No murmur heard. ?  No friction rub.  ?Pulmonary:  ?   Effort: Pulmonary effort is normal. No respiratory distress.  ?   Breath sounds: Normal breath sounds. No wheezing or rales.  ?Musculoskeletal:  ?   Right lower leg: No edema.  ?   Left lower leg: No edema.  ?   Comments: Mild discomfort  with ulnar wrist palpation. Small cyst/mass appreciated. No pain with mobility, negative phalens.  ?Skin: ?   Comments: Handful of scattered hypopigmented spots - right forearm 19m, left forearm 132m left forearm 20m39mRight lower leg 20mm36mot.  ?Neurological:  ?   Mental Status: She is alert and oriented to person, place, and time.  ?Psychiatric:     ?   Behavior: Behavior normal.  ? ? ?Assessment/Plan ? ?1. Right wrist pain ?- Ambulatory referral to Hand Surgery ? ?2. Ganglion cyst ?- Ambulatory referral to Hand Surgery ? ?3. Encounter for screening mammogram for malignant neoplasm of breast ?Will complete in August when she is 41. 6 MM DIGITAL SCREENING BILATERAL; Future ? ?4. [place]. Sx have been  stable.  ? ?5. Melanin disorder ?Would like to get more formal derm evaluation in case she continues to get new spots.  ?- Ambulatory referral to Dermatology ? ? ?Return if symptoms worsen or fail to improve. ? ? ? ? ? ?Kaylee Cowan ?

## 2021-06-08 NOTE — Telephone Encounter (Signed)
JCV ab drawn on 05/26/21 indeterminate, index: 0.40. Inhibition assay: pending. ?

## 2021-06-23 DIAGNOSIS — G35 Multiple sclerosis: Secondary | ICD-10-CM | POA: Diagnosis not present

## 2021-07-08 ENCOUNTER — Encounter: Payer: Self-pay | Admitting: Family Medicine

## 2021-07-08 ENCOUNTER — Ambulatory Visit (INDEPENDENT_AMBULATORY_CARE_PROVIDER_SITE_OTHER): Payer: BC Managed Care – PPO | Admitting: Family Medicine

## 2021-07-08 VITALS — BP 126/90 | HR 75 | Temp 98.0°F | Ht 65.25 in | Wt 227.6 lb

## 2021-07-08 DIAGNOSIS — E611 Iron deficiency: Secondary | ICD-10-CM

## 2021-07-08 DIAGNOSIS — R739 Hyperglycemia, unspecified: Secondary | ICD-10-CM | POA: Diagnosis not present

## 2021-07-08 DIAGNOSIS — Z0000 Encounter for general adult medical examination without abnormal findings: Secondary | ICD-10-CM

## 2021-07-08 DIAGNOSIS — R0989 Other specified symptoms and signs involving the circulatory and respiratory systems: Secondary | ICD-10-CM

## 2021-07-08 DIAGNOSIS — E785 Hyperlipidemia, unspecified: Secondary | ICD-10-CM

## 2021-07-08 DIAGNOSIS — D649 Anemia, unspecified: Secondary | ICD-10-CM | POA: Diagnosis not present

## 2021-07-08 DIAGNOSIS — G35 Multiple sclerosis: Secondary | ICD-10-CM

## 2021-07-08 DIAGNOSIS — Z6837 Body mass index (BMI) 37.0-37.9, adult: Secondary | ICD-10-CM

## 2021-07-08 DIAGNOSIS — G4733 Obstructive sleep apnea (adult) (pediatric): Secondary | ICD-10-CM

## 2021-07-08 DIAGNOSIS — E559 Vitamin D deficiency, unspecified: Secondary | ICD-10-CM | POA: Diagnosis not present

## 2021-07-08 DIAGNOSIS — D72829 Elevated white blood cell count, unspecified: Secondary | ICD-10-CM

## 2021-07-08 DIAGNOSIS — J4520 Mild intermittent asthma, uncomplicated: Secondary | ICD-10-CM

## 2021-07-08 LAB — IBC + FERRITIN
Ferritin: 35.8 ng/mL (ref 10.0–291.0)
Iron: 63 ug/dL (ref 42–145)
Saturation Ratios: 19.1 % — ABNORMAL LOW (ref 20.0–50.0)
TIBC: 329 ug/dL (ref 250.0–450.0)
Transferrin: 235 mg/dL (ref 212.0–360.0)

## 2021-07-08 LAB — CBC WITH DIFFERENTIAL/PLATELET
Basophils Absolute: 0.1 10*3/uL (ref 0.0–0.1)
Basophils Relative: 0.5 % (ref 0.0–3.0)
Eosinophils Absolute: 0.2 10*3/uL (ref 0.0–0.7)
Eosinophils Relative: 2 % (ref 0.0–5.0)
HCT: 38.3 % (ref 36.0–46.0)
Hemoglobin: 12.6 g/dL (ref 12.0–15.0)
Lymphocytes Relative: 46.1 % — ABNORMAL HIGH (ref 12.0–46.0)
Lymphs Abs: 4.9 10*3/uL — ABNORMAL HIGH (ref 0.7–4.0)
MCHC: 32.9 g/dL (ref 30.0–36.0)
MCV: 80.4 fl (ref 78.0–100.0)
Monocytes Absolute: 0.5 10*3/uL (ref 0.1–1.0)
Monocytes Relative: 5 % (ref 3.0–12.0)
Neutro Abs: 5 10*3/uL (ref 1.4–7.7)
Neutrophils Relative %: 46.4 % (ref 43.0–77.0)
Platelets: 357 10*3/uL (ref 150.0–400.0)
RBC: 4.76 Mil/uL (ref 3.87–5.11)
RDW: 15.5 % (ref 11.5–15.5)
WBC: 10.7 10*3/uL — ABNORMAL HIGH (ref 4.0–10.5)

## 2021-07-08 LAB — LIPID PANEL
Cholesterol: 205 mg/dL — ABNORMAL HIGH (ref 0–200)
HDL: 58.3 mg/dL (ref >39.00)
LDL Cholesterol: 139 mg/dL — ABNORMAL HIGH (ref 0–99)
NonHDL: 146.82
Total CHOL/HDL Ratio: 4
Triglycerides: 39 mg/dL (ref 0.0–149.0)
VLDL: 7.8 mg/dL (ref 0.0–40.0)

## 2021-07-08 LAB — TSH: TSH: 0.96 u[IU]/mL (ref 0.35–5.50)

## 2021-07-08 LAB — COMPREHENSIVE METABOLIC PANEL
ALT: 27 U/L (ref 0–35)
AST: 18 U/L (ref 0–37)
Albumin: 4.3 g/dL (ref 3.5–5.2)
Alkaline Phosphatase: 80 U/L (ref 39–117)
BUN: 11 mg/dL (ref 6–23)
CO2: 30 mEq/L (ref 19–32)
Calcium: 9.3 mg/dL (ref 8.4–10.5)
Chloride: 103 mEq/L (ref 96–112)
Creatinine, Ser: 0.71 mg/dL (ref 0.40–1.20)
GFR: 106.91 mL/min (ref >60.00)
Glucose, Bld: 83 mg/dL (ref 70–99)
Potassium: 3.9 mEq/L (ref 3.5–5.1)
Sodium: 137 mEq/L (ref 135–145)
Total Bilirubin: 0.7 mg/dL (ref 0.2–1.2)
Total Protein: 7.5 g/dL (ref 6.0–8.3)

## 2021-07-08 LAB — HEMOGLOBIN A1C: Hgb A1c MFr Bld: 5.7 % (ref 4.6–6.5)

## 2021-07-08 LAB — FOLATE: Folate: >23.5 ng/mL (ref >5.9)

## 2021-07-08 LAB — VITAMIN D 25 HYDROXY (VIT D DEFICIENCY, FRACTURES): VITD: 24.57 ng/mL — ABNORMAL LOW (ref 30.00–100.00)

## 2021-07-08 LAB — VITAMIN B12: Vitamin B-12: 541 pg/mL (ref 211–911)

## 2021-07-08 MED ORDER — LOSARTAN POTASSIUM 25 MG PO TABS: 12.5000 mg | ORAL_TABLET | Freq: Every day | ORAL | 1 refills | Status: AC

## 2021-07-08 NOTE — Patient Instructions (Signed)
*  ok to start 1/2 tablet of losartan. Our goal is to see blood pressures more in the 100-125/65-75 range. If pressures are regularly lower than that, then just use the losartan as needed.  ?

## 2021-07-08 NOTE — Progress Notes (Signed)
Kaylee Cowan ?DOB: 05-31-1980 ?Encounter date: 07/08/2021 ? ?This is a 41 y.o. female who presents for complete physical  ? ?History of present illness/Additional concerns: ?Last visit with me was last month ? ?Follows regularly with Dr. Elgie Cowan for GYN care. ?She did see cardiology in February to address blood pressure spikes that she gets after infusions for MS.  In the past, she was seen for occasional irregular heart rate/PVCs.  They did not feel she needed any treatment since other blood pressures were normal outside of situation where she is receiving infusion. ? ?*MS: Following with Kaylee Cowan.  Last visit was 02/17/2021.  She is on Tysabri.  She was referred for oral appliance for sleep.she had a lot of things on her plate and OOP cost was quite high for her, so she put this off.  ? ?*Mild obstructive sleep apnea: No treatment recommended outside of healthy lifestyle changes/weight loss. Working on getting weight improved, which she knows will help. Sleep disorder was present prior to diagnosis of mild sleep apnea. At highest her weight was 240lb so she is losing.  ? ?*Asthma:did have flare about a week ago. Before that was a couple months prior. Generally good control.  ? ?*Migraine:doing well.  ? ?Energy level feels like it is getting better. She does feel like low iron triggers increased PVC. When she takes more iron she does better.  ? ?Past Medical History:  ?Diagnosis Date  ? Anemia   ? Asthma   ? Giant cell tumor   ? Hypersomnolence   ? Migraines   ? Sleep disorder   ? ?Past Surgical History:  ?Procedure Laterality Date  ? MASS EXCISION Right 05/16/2019  ? Procedure: Excision right wrist volar mass;  Surgeon: Kaylee Planas, MD;  Location: [facility];  Service: Orthopedics;  Laterality: Right;  with IV sedation  ? NASAL SEPTUM SURGERY    ? tissue removed from nose  ? REFRACTIVE SURGERY    ? ROTATOR CUFF REPAIR    ? TONSILLECTOMY    ? ?Allergies  ?Allergen Reactions  ? Bee Venom  Anaphylaxis  ? ?Current Meds  ?Medication Sig  ? albuterol (VENTOLIN HFA) 108 (90 Base) MCG/ACT inhaler Inhale 1-2 puffs into the lungs every 4 (four) hours as needed for wheezing or shortness of breath.  ? Armodafinil 200 MG TABS One po qAM  ? EPINEPHrine (EPIPEN 2-PAK) 0.3 mg/0.3 mL IJ SOAJ injection Inject 0.3 mg into the muscle once as needed for up to 1 dose (for severe allergic reaction). CAll 911 immediately if you have to use this medicine  ? Ferrous Fumarate (IRON) 18 MG TBCR Take by mouth.  ? levonorgestrel (LILETTA, 52 MG,) 19.5 MCG/DAY IUD IUD 1 each by Intrauterine route once.  ? losartan (COZAAR) 25 MG tablet Take 0.5 tablets (12.5 mg total) by mouth daily.  ? Multiple Vitamin (MULTIVITAMIN ADULT) TABS Take by mouth.  ? Natalizumab (TYSABRI IV) Inject into the vein every 30 (thirty) days.  ? topiramate (TOPAMAX) 50 MG tablet Take 50-100 mg by mouth at bedtime.  ? ?Social History  ? ?Tobacco Use  ? Smoking status: Never  ? Smokeless tobacco: Never  ?Substance Use Topics  ? Alcohol use: Not Currently  ? ?Family History  ?Problem Relation Age of Onset  ? Diabetes Mother   ? Hypertension Father   ? Hyperlipidemia Father   ? Aortic dissection Father 17  ? Early death Maternal Grandmother   ? Aortic dissection Maternal Grandmother 70  ? Early death  Maternal Grandfather   ? Heart attack Paternal Grandmother   ? Lupus Sister   ? Eczema Niece   ? ? ? ?Review of Systems  ?Constitutional:  Positive for fatigue. Negative for activity change, appetite change, chills, fever and unexpected weight change.  ?HENT:  Negative for congestion, ear pain, hearing loss, sinus pressure, sinus pain, sore throat and trouble swallowing.   ?Eyes:  Negative for pain and visual disturbance.  ?Respiratory:  Negative for cough, chest tightness, shortness of breath and wheezing.   ?Cardiovascular:  Negative for chest pain, palpitations and leg swelling.  ?Gastrointestinal:  Negative for abdominal pain, blood in stool, constipation,  diarrhea, nausea and vomiting.  ?Genitourinary:  Negative for difficulty urinating and menstrual problem.  ?Musculoskeletal:  Negative for arthralgias and back pain.  ?Skin:  Negative for rash.  ?Neurological:  Negative for dizziness, weakness, numbness and headaches.  ?Hematological:  Negative for adenopathy. Does not bruise/bleed easily.  ?Psychiatric/Behavioral:  Negative for sleep disturbance and suicidal ideas. The patient is not nervous/anxious.   ? ?CBC:  ?Lab Results  ?Component Value Date  ? WBC 9.6 05/26/2021  ? WBC 9.7 06/13/2020  ? HGB 12.1 05/26/2021  ? HCT 37.9 05/26/2021  ? MCH 25.9 (L) 05/26/2021  ? MCH 27.2 06/13/2020  ? MCHC 31.9 05/26/2021  ? MCHC 32.3 06/13/2020  ? RDW 13.6 05/26/2021  ? PLT 374 05/26/2021  ? MPV 9.6 09/25/2019  ? ?CMP: ?Lab Results  ?Component Value Date  ? NA 138 06/13/2020  ? K 3.8 06/13/2020  ? CL 105 06/13/2020  ? CO2 23 06/13/2020  ? ANIONGAP 10 06/13/2020  ? GLUCOSE 99 06/13/2020  ? BUN 17 06/13/2020  ? CREATININE 0.70 06/13/2020  ? CREATININE 0.73 09/25/2019  ? GFRAA >60 09/14/2019  ? CALCIUM 9.3 06/13/2020  ? PROT 7.4 10/16/2020  ? BILITOT 0.5 10/16/2020  ? ALKPHOS 101 10/16/2020  ? ALT 10 10/16/2020  ? AST 13 10/16/2020  ? ?LIPID: ?Lab Results  ?Component Value Date  ? CHOL 203 (H) 10/23/2020  ? TRIG 73.0 10/23/2020  ? HDL 46.30 10/23/2020  ? LDLCALC 142 (H) 10/23/2020  ? LDLCALC 113 (H) 09/25/2019  ? ? ?Objective: ? ?BP 126/90   Pulse 75   Temp 98 ?F (36.7 ?C) (Oral)   Ht 5' 5.25" (1.657 m)   Wt 227 lb 9.6 oz (103.2 kg)   SpO2 100%   BMI 37.58 kg/m?   Weight: 227 lb 9.6 oz (103.2 kg)  ? ?BP Readings from Last 3 Encounters:  ?07/08/21 126/90  ?05/27/21 118/82  ?05/05/21 120/90  ? ?Wt Readings from Last 3 Encounters:  ?07/08/21 227 lb 9.6 oz (103.2 kg)  ?05/27/21 223 lb 14.4 oz (101.6 kg)  ?05/05/21 224 lb 2 oz (101.7 kg)  ? ? ?Physical Exam ?Constitutional:   ?   General: She is not in acute distress. ?   Appearance: She is well-developed.  ?HENT:  ?   Head:  Normocephalic and atraumatic.  ?   Right Ear: External ear normal.  ?   Left Ear: External ear normal.  ?   Mouth/Throat:  ?   Pharynx: No oropharyngeal exudate.  ?Eyes:  ?   Conjunctiva/sclera: Conjunctivae normal.  ?   Pupils: Pupils are equal, round, and reactive to light.  ?Neck:  ?   Thyroid: No thyromegaly.  ?Cardiovascular:  ?   Rate and Rhythm: Normal rate and regular rhythm.  ?   Heart sounds: Normal heart sounds. No murmur heard. ?  No friction rub. No  gallop.  ?Pulmonary:  ?   Effort: Pulmonary effort is normal.  ?   Breath sounds: Normal breath sounds.  ?Abdominal:  ?   General: Bowel sounds are normal. There is no distension.  ?   Palpations: Abdomen is soft. There is no mass.  ?   Tenderness: There is no abdominal tenderness. There is no guarding.  ?   Hernia: No hernia is present.  ?Musculoskeletal:     ?   General: No tenderness or deformity. Normal range of motion.  ?   Cervical back: Normal range of motion and neck supple.  ?Lymphadenopathy:  ?   Cervical: No cervical adenopathy.  ?Skin: ?   General: Skin is warm and dry.  ?   Findings: No rash.  ?Neurological:  ?   Mental Status: She is alert and oriented to person, place, and time.  ?   Deep Tendon Reflexes: Reflexes normal.  ?   Reflex Scores: ?     Tricep reflexes are 2+ on the right side and 2+ on the left side. ?     Bicep reflexes are 2+ on the right side and 2+ on the left side. ?     Brachioradialis reflexes are 2+ on the right side and 2+ on the left side. ?     Patellar reflexes are 2+ on the right side and 2+ on the left side. ?Psychiatric:     ?   Speech: Speech normal.     ?   Behavior: Behavior normal.     ?   Thought Content: Thought content normal.  ? ? ?Assessment/Plan: ?There are no preventive care reminders to display for this patient. ? ?Health Maintenance reviewed. ? ? ?1. Preventative health care ?Keep up with regular exercise and healthy eating. ? ?2. Multiple sclerosis ([place]) ?Following with neurology regularly.  Symptoms  are improved. ? ?3. Class 2 severe obesity due to excess calories with serious comorbidity and body mass index (BMI) of 37.0 to 37.9 in adult [facility]) ?She would like to maintain good health and healthy weight.  She does regula

## 2021-07-10 NOTE — Addendum Note (Signed)
Addended by: Agnes Lawrence on: 07/10/2021 09:58 AM ? ? Modules accepted: Orders ? ?

## 2021-07-14 ENCOUNTER — Other Ambulatory Visit (INDEPENDENT_AMBULATORY_CARE_PROVIDER_SITE_OTHER): Payer: BC Managed Care – PPO

## 2021-07-14 DIAGNOSIS — D72829 Elevated white blood cell count, unspecified: Secondary | ICD-10-CM | POA: Diagnosis not present

## 2021-07-14 LAB — CBC WITH DIFFERENTIAL/PLATELET
Basophils Absolute: 0 10*3/uL (ref 0.0–0.1)
Basophils Relative: 0.3 % (ref 0.0–3.0)
Eosinophils Absolute: 0.2 10*3/uL (ref 0.0–0.7)
Eosinophils Relative: 2.1 % (ref 0.0–5.0)
HCT: 36.4 % (ref 36.0–46.0)
Hemoglobin: 11.9 g/dL — ABNORMAL LOW (ref 12.0–15.0)
Lymphocytes Relative: 46.7 % — ABNORMAL HIGH (ref 12.0–46.0)
Lymphs Abs: 4.2 10*3/uL — ABNORMAL HIGH (ref 0.7–4.0)
MCHC: 32.8 g/dL (ref 30.0–36.0)
MCV: 80.1 fl (ref 78.0–100.0)
Monocytes Absolute: 0.6 10*3/uL (ref 0.1–1.0)
Monocytes Relative: 6.2 % (ref 3.0–12.0)
Neutro Abs: 4 10*3/uL (ref 1.4–7.7)
Neutrophils Relative %: 44.7 % (ref 43.0–77.0)
Platelets: 326 10*3/uL (ref 150.0–400.0)
RBC: 4.54 Mil/uL (ref 3.87–5.11)
RDW: 15.4 % (ref 11.5–15.5)
WBC: 8.9 10*3/uL (ref 4.0–10.5)

## 2021-07-21 DIAGNOSIS — G35 Multiple sclerosis: Secondary | ICD-10-CM | POA: Diagnosis not present

## 2021-07-30 DIAGNOSIS — G35 Multiple sclerosis: Secondary | ICD-10-CM | POA: Diagnosis not present

## 2021-08-18 ENCOUNTER — Encounter: Payer: Self-pay | Admitting: Neurology

## 2021-08-18 ENCOUNTER — Ambulatory Visit (INDEPENDENT_AMBULATORY_CARE_PROVIDER_SITE_OTHER): Payer: BC Managed Care – PPO | Admitting: Neurology

## 2021-08-18 ENCOUNTER — Telehealth: Payer: Self-pay | Admitting: *Deleted

## 2021-08-18 VITALS — BP 135/80 | HR 115 | Ht 65.25 in | Wt 229.0 lb

## 2021-08-18 DIAGNOSIS — G4710 Hypersomnia, unspecified: Secondary | ICD-10-CM | POA: Diagnosis not present

## 2021-08-18 DIAGNOSIS — R200 Anesthesia of skin: Secondary | ICD-10-CM | POA: Diagnosis not present

## 2021-08-18 DIAGNOSIS — D509 Iron deficiency anemia, unspecified: Secondary | ICD-10-CM

## 2021-08-18 DIAGNOSIS — Z79899 Other long term (current) drug therapy: Secondary | ICD-10-CM

## 2021-08-18 DIAGNOSIS — G35 Multiple sclerosis: Secondary | ICD-10-CM | POA: Diagnosis not present

## 2021-08-18 DIAGNOSIS — G4730 Sleep apnea, unspecified: Secondary | ICD-10-CM

## 2021-08-18 DIAGNOSIS — I7300 Raynaud's syndrome without gangrene: Secondary | ICD-10-CM

## 2021-08-18 MED ORDER — NIFEDIPINE ER OSMOTIC RELEASE 30 MG PO TB24: 30.0000 mg | ORAL_TABLET | Freq: Every day | ORAL | 11 refills | Status: AC

## 2021-08-18 MED ORDER — ARMODAFINIL 200 MG PO TABS: ORAL_TABLET | ORAL | 5 refills | Status: DC

## 2021-08-18 NOTE — Telephone Encounter (Signed)
Placed JCV lab in [facility] lock box for routine lab pick up. Results pending. 

## 2021-08-18 NOTE — Progress Notes (Signed)
[facility]  PATIENT: Kaylee Cowan DOB: March 12, 1981  REFERRING DOCTOR OR PCP:  Micheline Rough, MD SOURCE: patient, notes from Neurology, PCP and ED, imaging and lab reports, MRI images personally reviewed.  _________________________________   HISTORICAL  CHIEF COMPLAINT:  Chief Complaint  Patient presents with   Follow-up    Rm 1, alone. Here for 6 month MS f/u, on Tysabri and tolerating well. Last infusion date: 07/21/2021 and Next infusion date: 08/18/2021. Pt has a migraine today. Having tingling throughout her body when bending her neck. Occa overall weakness and fatigue.     HISTORY OF PRESENT ILLNESS:  Kaylee Cowan is a 41 y.o. 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   No exacerbatios or new neuro symptom.   Last JCV 02/2021 was 0.69.      Her  MS has been stable since her last visit.  She is in the process of getting started on Tysabri..   She has had little hives after some infusions but an antihistamine quickly clears it up    She has lot of environmental allergies.     She has a JCV low positive 0.52.       She has not had any exaceraions in last month   She has idiopathic hypersomnia.  [facility] Neurology Dr. York Pellant).  She had a PSG/MSLT I 2019 and reportedly had no OSA but had a short sleep latency.     She has spells where she gets emotional and gets sleepy and falls asleep.  She has vivid dreams but does not have sleep paralysis.   She no longer naps.  Short naps had not helped.     She sleep 7-8 hours at night.   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  Armodafinil has helped.  It helps as much as Ritalin with better tolerability.    MSLT in 2019 did not show narcolepsy but she had a trace of stimulant on the tox screen (was off for several days though).   There was no REM sleep but Mean sleep latency of 2.3 monutes.     She had gained weight in  2020 and a HST showed mild OSA.   She has lost 20 pounds since then.    Armodafinil helps some.   She had tried a stimulant in the past but her feet get cold when she takes a stimulant.     Medical issues include HTN and migraine.   Zonisamide helps migraines bur was not tolerated well so she went back on topiramate.    Vit D was low (24.5 in April 2023) and she is taking 2000 U daily.   EPWORTH SLEEPINESS SCALE  On a scale of 0 - 3 what is the chance of dozing:  Sitting and Reading:   0 Watching TV:    1 Sitting inactive in a public place: 1 Passenger in car for one hour: 2 Lying down to rest in the afternoon: 1 Sitting and talking to someone: 1 Sitting quietly after lunch:  1 In a car, stopped in traffic:  0  Total (out of 24):    7/24   (no excessive sleepiness on Armodafinil).   Problems only after 3 pm    History of MS: She developed a Lhermitte sign in 2021 a week or two after the first Covid-19 vaccination [employer]) in March 2021.  She also had pain behind  her eyes.    She had similar eye pain after the 2nd shot and after her booster later in 2021.  When she bends her head, she gets a buzzing sensation down her neck. These occur multiple times a day and just lasts a few seconds.   The sensation is buzzing not pain.   She feels it into her arms, trunks and proximal legs.  She went to orthopedics as she also had also had some mid and lower back pain.  She did PT but she continues to experience these symptoms.     In July 2021 she had an episode of left sided numbness and weakness that lasted a day .     She went to the ED and had an MRI showing 2 T2 hyperintense foci.     She was felt to have a complicated migraine.     Her MRI of the brain 10/03/2020 that showed 7 T2/FLAIR hyperintense foci in the hemispheres including 2 in the left splenium that were radially oriented to the ventricles, 2 juxtacortical foci and 3 deep white matter foci.  None of the foci enhance.  At least 3 of these  foci currently on the present MRI were not present on the MRI from 2021 consistent with progression and satisfying the dissemination in time criteria for MS.  Additionally, she has a focus within her cervical spine likely the source of the Lhermitte sign.     She also had a history of urinary incontinence and tingling in her legs in 2019.  She also had vertio leading to falls.   Since TPM had just been started she though it may have been related.    MRI     Vit D was 19.9.   Heavy metal test showed increase nickel ad tin   Aluminum was borderline high.   IMAGING: MRI of the brain from 09/14/2019.  It shows 2 T2/FLAIR hyperintense foci in the subcortical white matter of the left frontal lobe.  Neither these appear to be acute.  They do not enhance.  No periventricular or infratentorial foci were noted.  MRI cervical spie 09/06/2020 showed a 2-3 mm focus of T2 hyperintensity in the dorsal midline of the spinal cord at C2-3 without enhancement. She also had some DJD  MRI brain 10/03/2020 showed a stable appearance of focal subcortical T2 hyperintensity in the left frontal operculum and anterior left frontal lobe. Findings are consistent with the given diagnosis of multiple sclerosis.    By my review, 3 foci were not present on the 2021 MRI of the brain showing progression over time.    demyelination.  REVIEW OF SYSTEMS: Constitutional: No fevers, chills, sweats, or change in appetite Eyes: No visual changes, double vision, eye pain Ear, nose and throat: No hearing loss, ear pain, nasal congestion, sore throat Cardiovascular: No chest pain, palpitations Respiratory:  No shortness of breath at rest or with exertion.   No wheezes GastrointestinaI: No nausea, vomiting, diarrhea, abdominal pain, fecal incontinence Genitourinary:  No dysuria, urinary retention or frequency.  No nocturia. Musculoskeletal:  No neck pain, back pain Integumentary: No rash, pruritus, skin lesions Neurological: as  above Psychiatric: No depression at this time.  No anxiety Endocrine: No palpitations, diaphoresis, change in appetite, change in weigh or increased thirst Hematologic/Lymphatic:  No anemia, purpura, petechiae. Allergic/Immunologic: No itchy/runny eyes, nasal congestion, recent allergic reactions, rashes  ALLERGIES: Allergies  Allergen Reactions   Bee Venom Anaphylaxis    HOME MEDICATIONS:  Current Outpatient Medications:  albuterol (VENTOLIN HFA) 108 (90 Base) MCG/ACT inhaler, Inhale 1-2 puffs into the lungs every 4 (four) hours as needed for wheezing or shortness of breath., Disp: 1 each, Rfl: 0   EPINEPHrine (EPIPEN 2-PAK) 0.3 mg/0.3 mL IJ SOAJ injection, Inject 0.3 mg into the muscle once as needed for up to 1 dose (for severe allergic reaction). CAll 911 immediately if you have to use this medicine, Disp: 1 each, Rfl: 1   Ferrous Fumarate (IRON) 18 MG TBCR, Take by mouth., Disp: , Rfl:    levonorgestrel (LILETTA, 52 MG,) 19.5 MCG/DAY IUD IUD, 1 each by Intrauterine route once., Disp: , Rfl:    losartan (COZAAR) 25 MG tablet, Take 0.5 tablets (12.5 mg total) by mouth daily., Disp: 90 tablet, Rfl: 1   MAGNESIUM PO, Take by mouth daily., Disp: , Rfl:    Multiple Vitamin (MULTIVITAMIN ADULT) TABS, Take by mouth., Disp: , Rfl:    Natalizumab (TYSABRI IV), Inject into the vein every 30 (thirty) days., Disp: , Rfl:    NIFEdipine (PROCARDIA-XL/NIFEDICAL-XL) 30 MG 24 hr tablet, Take 1 tablet (30 mg total) by mouth daily., Disp: 30 tablet, Rfl: 11   topiramate (TOPAMAX) 50 MG tablet, Take 50-100 mg by mouth at bedtime., Disp: , Rfl:    Vitamin D, Cholecalciferol, 25 MCG (1000 UT) TABS, Take 1 tablet by mouth daily., Disp: , Rfl:    Armodafinil 200 MG TABS, One po qAM and 1/2 po qNoon, Disp: 45 tablet, Rfl: 5  PAST MEDICAL HISTORY: Past Medical History:  Diagnosis Date   Anemia    Asthma    Giant cell tumor    Hypersomnolence    Migraines    Sleep disorder     PAST SURGICAL  HISTORY: Past Surgical History:  Procedure Laterality Date   MASS EXCISION Right 05/16/2019   Procedure: Excision right wrist volar mass;  Surgeon: Iran Planas, MD;  Location: [facility];  Service: Orthopedics;  Laterality: Right;  with IV sedation   NASAL SEPTUM SURGERY     tissue removed from nose   REFRACTIVE SURGERY     ROTATOR CUFF REPAIR     TONSILLECTOMY      FAMILY HISTORY: Family History  Problem Relation Age of Onset   Diabetes Mother    Hypertension Father    Hyperlipidemia Father    Aortic dissection Father 60   Early death Maternal Grandmother    Aortic dissection Maternal Grandmother 17   Early death Maternal Grandfather    Heart attack Paternal Grandmother    Lupus Sister    Eczema Niece     SOCIAL HISTORY:  Social History   Socioeconomic History   Marital status: Single    Spouse name: Not on file   Number of children: 0   Years of education: 70   Highest education level: Not on file  Occupational History   Not on file  Tobacco Use   Smoking status: Never   Smokeless tobacco: Never  Vaping Use   Vaping Use: Never used  Substance and Sexual Activity   Alcohol use: Not Currently   Drug use: Never   Sexual activity: Not Currently    Birth control/protection: I.U.D.  Other Topics Concern   Not on file  Social History Narrative   Right handed   Caffeine use: 3 times per week (coffee, no soda, energy drink once per week)   Social Determinants of Health   Financial Resource Strain: Not on file  Food Insecurity: Not on file  Transportation  Needs: Not on file  Physical Activity: Not on file  Stress: Not on file  Social Connections: Not on file  Intimate Partner Violence: Not on file     PHYSICAL EXAM  Vitals:   08/18/21 0819  BP: 135/80  Pulse: (!) 115  Weight: 229 lb (103.9 kg)  Height: 5' 5.25" (1.657 m)    Body mass index is 37.82 kg/m.   General: The patient is well-developed and well-nourished and in no acute  distress  HEENT:  Head is [place]/AT.  Sclera are anicteric.    Skin: Extremities are without rash or  edema.  Musculoskeletal:  Back is nontender  Neurologic Exam  Mental status: The patient is alert and oriented x 3 at the time of the examination. The patient has apparent normal recent and remote memory, with an apparently normal attention span and concentration ability.   Speech is normal.  Cranial nerves: Extraocular movements are full.  Pupils react equally.  Facial symmetry is present.  There is good facial sensation to soft touch bilaterally.Facial strength is normal.  Trapezius and sternocleidomastoid strength is normal. No dysarthria is noted.  Symmetric hearing.  Motor:  Muscle bulk is normal.   Tone is normal. Strength is  5 / 5 in all 4 extremities.   Sensory: Intact to touch and sensation in the arms and legs.  Coordination: Cerebellar testing reveals good finger-nose-finger and heel-to-shin bilaterally.  Gait and station: Station is normal.   Gait is normal.  Her tandem gait is mildly wide.  The Romberg is negative.  Reflexes: Deep tendon reflexes are symmetric and normal bilaterally.       DIAGNOSTIC DATA (LABS, IMAGING, TESTING) - I reviewed patient records, labs, notes, testing and imaging myself where available.  Lab Results  Component Value Date   WBC 8.9 07/14/2021   HGB 11.9 (L) 07/14/2021   HCT 36.4 07/14/2021   MCV 80.1 07/14/2021   PLT 326.0 07/14/2021      Component Value Date/Time   NA 137 07/08/2021 1421   K 3.9 07/08/2021 1421   CL 103 07/08/2021 1421   CO2 30 07/08/2021 1421   GLUCOSE 83 07/08/2021 1421   BUN 11 07/08/2021 1421   CREATININE 0.71 07/08/2021 1421   CREATININE 0.73 09/25/2019 0832   CALCIUM 9.3 07/08/2021 1421   PROT 7.5 07/08/2021 1421   PROT 7.4 10/16/2020 0928   ALBUMIN 4.3 07/08/2021 1421   ALBUMIN 4.3 10/16/2020 0928   AST 18 07/08/2021 1421   ALT 27 07/08/2021 1421   ALKPHOS 80 07/08/2021 1421   BILITOT 0.7 07/08/2021  1421   BILITOT 0.5 10/16/2020 0928   GFRNONAA >60 06/13/2020 2024   GFRAA >60 09/14/2019 2154   Lab Results  Component Value Date   CHOL 205 (H) 07/08/2021   HDL 58.30 07/08/2021   LDLCALC 139 (H) 07/08/2021   TRIG 39.0 07/08/2021   CHOLHDL 4 07/08/2021   Lab Results  Component Value Date   HGBA1C 5.7 07/08/2021   Lab Results  Component Value Date   VITAMINB12 541 07/08/2021   Lab Results  Component Value Date   TSH 0.96 07/08/2021       ASSESSMENT AND PLAN  Multiple sclerosis ([place]) - Plan: Stratify JCV Antibody Test ([facility]), CBC with Differential/Platelet, Hepatic function panel, MR BRAIN W WO CONTRAST  High risk medication use - Plan: Stratify JCV Antibody Test ([facility]), CBC with Differential/Platelet, Hepatic function panel  Hypersomnia with sleep apnea  Numbness - Plan: MR BRAIN W WO CONTRAST  Raynaud's  phenomenon without gangrene  Iron deficiency anemia, unspecified iron deficiency anemia type - Plan: Ferritin, Iron and TIBC, CBC with Differential/Platelet, Hepatic function panel   Continue Tysabri for MS.  We will check JCV antibody and CBC today  She has idiopathic hypersomnia vs narcolepsy She also has mild OSA.  Consider an oral appliance if she feels she gets a benefit.   She may have Raynaud's - add nifedipine and consider reducing or cutting losartan if BP loo low and nifedipine helps (also consider inc dose of nifedipine based on response/BP).  ALso has had iron deficiency and will check labs She will return in 6 months or sooner if there are new or worsening neurologic symptoms.Marland Kitchen  40-minute office visit with the majority of the time spent face-to-face for history and physical, discussion/counseling and decision-making.  Additional time with record review and documentation.  Thurston Brendlinger A. Felecia Shelling, MD, [facility] 03/15/1550, 0:80 AM Certified in Neurology, Clinical Neurophysiology, Sleep Medicine and Neuroimaging  [facility] [address]  [place], [place] [address] [phone]

## 2021-08-19 DIAGNOSIS — G35 Multiple sclerosis: Secondary | ICD-10-CM | POA: Diagnosis not present

## 2021-08-19 LAB — CBC WITH DIFFERENTIAL/PLATELET
Basophils Absolute: 0 10*3/uL (ref 0.0–0.2)
Basos: 0 %
EOS (ABSOLUTE): 0.2 10*3/uL (ref 0.0–0.4)
Eos: 1 %
Hematocrit: 39.5 % (ref 34.0–46.6)
Hemoglobin: 12.9 g/dL (ref 11.1–15.9)
Immature Grans (Abs): 0.1 10*3/uL (ref 0.0–0.1)
Immature Granulocytes: 1 %
Lymphocytes Absolute: 4.8 10*3/uL — ABNORMAL HIGH (ref 0.7–3.1)
Lymphs: 43 %
MCH: 26.2 pg — ABNORMAL LOW (ref 26.6–33.0)
MCHC: 32.7 g/dL (ref 31.5–35.7)
MCV: 80 fL (ref 79–97)
Monocytes Absolute: 0.8 10*3/uL (ref 0.1–0.9)
Monocytes: 7 %
Neutrophils Absolute: 5.4 10*3/uL (ref 1.4–7.0)
Neutrophils: 48 %
Platelets: 380 10*3/uL (ref 150–450)
RBC: 4.92 x10E6/uL (ref 3.77–5.28)
RDW: 14.4 % (ref 11.7–15.4)
WBC: 11.3 10*3/uL — ABNORMAL HIGH (ref 3.4–10.8)

## 2021-08-19 LAB — HEPATIC FUNCTION PANEL
ALT: 23 IU/L (ref 0–32)
AST: 17 IU/L (ref 0–40)
Albumin: 4.7 g/dL (ref 3.8–4.8)
Alkaline Phosphatase: 115 IU/L (ref 44–121)
Bilirubin Total: 0.4 mg/dL (ref 0.0–1.2)
Bilirubin, Direct: 0.11 mg/dL (ref 0.00–0.40)
Total Protein: 7.9 g/dL (ref 6.0–8.5)

## 2021-08-19 LAB — IRON AND TIBC
Iron Saturation: 14 % — ABNORMAL LOW (ref 15–55)
Iron: 46 ug/dL (ref 27–159)
Total Iron Binding Capacity: 318 ug/dL (ref 250–450)
UIBC: 272 ug/dL (ref 131–425)

## 2021-08-19 LAB — FERRITIN: Ferritin: 110 ng/mL (ref 15–150)

## 2021-08-25 NOTE — Telephone Encounter (Signed)
Received the result from [facility] for the pt. JCV antibody was positive  Index Value was 0.44.  March 2023, JCV was indeterminate at 0.40. will send to Dr Felecia Shelling for review.

## 2021-08-26 ENCOUNTER — Encounter: Payer: Self-pay | Admitting: Neurology

## 2021-08-26 ENCOUNTER — Telehealth: Payer: Self-pay | Admitting: Neurology

## 2021-08-26 NOTE — Telephone Encounter (Signed)
[employer] Auth: [id number] exp. 08/26/21-09/24/21 sent to [facility] patient has pace maker

## 2021-08-26 NOTE — Telephone Encounter (Signed)
Per Dr Felecia Shelling:  "The JCV remains borderline positive.  Please check with the infusion service.  If she is not on Tysabri every 6 weeks I would like to change her to every 6 weeks"  I have spoke with the infusion suite and they stated that she is currently every 4 weeks. They will make the update to infuse every 6 weeks and check JCV every 3 months.

## 2021-09-29 ENCOUNTER — Encounter (INDEPENDENT_AMBULATORY_CARE_PROVIDER_SITE_OTHER): Payer: Self-pay

## 2021-09-29 DIAGNOSIS — G35 Multiple sclerosis: Secondary | ICD-10-CM | POA: Diagnosis not present

## 2021-09-29 DIAGNOSIS — Z0289 Encounter for other administrative examinations: Secondary | ICD-10-CM

## 2021-09-30 ENCOUNTER — Encounter: Payer: Self-pay | Admitting: Neurology

## 2021-10-01 NOTE — Telephone Encounter (Signed)
Kaylee Cowan: [id number] exp. 10/01/21-10/30/21 sent to [facility] I sent the patient the phone number to centralized scheduling

## 2021-10-13 ENCOUNTER — Ambulatory Visit (INDEPENDENT_AMBULATORY_CARE_PROVIDER_SITE_OTHER): Payer: BC Managed Care – PPO | Admitting: Family Medicine

## 2021-10-17 ENCOUNTER — Ambulatory Visit (HOSPITAL_COMMUNITY)
Admission: RE | Admit: 2021-10-17 | Discharge: 2021-10-17 | Disposition: A | Discharge status: Home / Self Care | Payer: BC Managed Care – PPO | Source: Ambulatory Visit | Attending: Neurology | Admitting: Neurology

## 2021-10-17 DIAGNOSIS — G35 Multiple sclerosis: Secondary | ICD-10-CM | POA: Insufficient documentation

## 2021-10-17 DIAGNOSIS — G35D Multiple sclerosis, unspecified: Secondary | ICD-10-CM

## 2021-10-17 DIAGNOSIS — R200 Anesthesia of skin: Secondary | ICD-10-CM | POA: Diagnosis not present

## 2021-10-17 MED ORDER — GADOBUTROL 1 MMOL/ML IV SOLN
10.0000 mL | Freq: Once | INTRAVENOUS | Status: AC | PRN
  Administered 2021-10-17: 10 mL via INTRAVENOUS

## 2021-10-20 ENCOUNTER — Other Ambulatory Visit: Payer: Self-pay | Admitting: *Deleted

## 2021-10-20 MED ORDER — TOPIRAMATE 50 MG PO TABS: 50.0000 mg | ORAL_TABLET | Freq: Every day | ORAL | 0 refills | Status: AC

## 2021-10-20 NOTE — Telephone Encounter (Signed)
Rx done-see Mychart message.

## 2021-10-21 ENCOUNTER — Encounter (INDEPENDENT_AMBULATORY_CARE_PROVIDER_SITE_OTHER): Payer: Self-pay

## 2021-10-25 ENCOUNTER — Encounter: Payer: Self-pay | Admitting: Cardiology

## 2021-10-25 ENCOUNTER — Encounter: Payer: Self-pay | Admitting: Neurology

## 2021-10-27 ENCOUNTER — Ambulatory Visit (INDEPENDENT_AMBULATORY_CARE_PROVIDER_SITE_OTHER): Payer: BC Managed Care – PPO | Admitting: Family Medicine

## 2021-12-28 ENCOUNTER — Telehealth: Payer: Self-pay | Admitting: Neurology

## 2021-12-28 ENCOUNTER — Ambulatory Visit
Admission: EM | Admit: 2021-12-28 | Discharge: 2021-12-28 | Disposition: A | Discharge status: Home / Self Care | Payer: BC Managed Care – PPO | Attending: Emergency Medicine | Admitting: Emergency Medicine

## 2021-12-28 DIAGNOSIS — G35 Multiple sclerosis: Secondary | ICD-10-CM | POA: Diagnosis not present

## 2021-12-28 DIAGNOSIS — M2550 Pain in unspecified joint: Secondary | ICD-10-CM

## 2021-12-28 DIAGNOSIS — G379 Demyelinating disease of central nervous system, unspecified: Secondary | ICD-10-CM

## 2021-12-28 DIAGNOSIS — N3941 Urge incontinence: Secondary | ICD-10-CM

## 2021-12-28 DIAGNOSIS — Z79899 Other long term (current) drug therapy: Secondary | ICD-10-CM

## 2021-12-28 MED ORDER — PREDNISONE 10 MG (21) PO TBPK: ORAL_TABLET | Freq: Every day | ORAL | 0 refills | Status: DC

## 2021-12-28 NOTE — Telephone Encounter (Signed)
Referral placed for the patient 

## 2021-12-28 NOTE — Telephone Encounter (Signed)
Pt is calling. Stated she is moving and need a referral sent to [facility] in [place].

## 2021-12-28 NOTE — Discharge Instructions (Addendum)
Take the prednisone as directed.  Follow up with your primary care provider or neurologist.

## 2021-12-28 NOTE — ED Triage Notes (Signed)
Patient presents to UC for MS flare up x 2 weeks. States she receives infusion therapy unable to get treatment x 1 month. Pt states she has been taking ibuprofen with no relief.

## 2021-12-28 NOTE — ED Provider Notes (Signed)
Kaylee Cowan    CSN: [id number] Arrival date & time: 12/28/21  1523      History   Chief Complaint Chief Complaint  Patient presents with   Multiple Sclerosis    HPI Kaylee Cowan is a 41 y.o. female.  Patient presents with arthralgias in bilateral knees, fingers, neck x 2 weeks.  No fever, rash, chest pain, shortness of breath, abdominal pain, or other symptoms.  She states her pain is caused by a flare of her multiple sclerosis.  She has not seen her neurologist since June and self-discharged from that practice in August.  She is awaiting a referral by her PCP to a Multiple Sclerosis center in [place].    The history is provided by the patient and medical records.    Past Medical History:  Diagnosis Date   Anemia    Asthma    Giant cell tumor    Hypersomnolence    Migraines    Sleep disorder     Patient Active Problem List   Diagnosis Date Noted   Multiple sclerosis ([place]) 10/19/2020   High risk medication use 10/19/2020   Urge incontinence 08/20/2020   Lhermitte's sign positive 08/20/2020   Numbness 08/20/2020   Presence of intrauterine contraceptive device (IUD) 11/28/2019   Encounter for administration of vaccine 11/28/2019   Low back pain 11/20/2019   Mild obstructive sleep apnea 11/08/2019   Excessive daytime sleepiness 11/08/2019   Encounter for removal and reinsertion of intrauterine contraceptive device (IUD) 10/18/2019   Women's annual routine gynecological examination 10/18/2019   Sleep disorder    Hypersomnia with sleep apnea    Asthma    Giant cell tumor    Bilateral leg edema 09/12/2019   Bilateral leg numbness 09/12/2019   Lower extremity edema 08/21/2019   Peripheral vasoconstriction ([facility]) 08/21/2019   Idiopathic hypersomnia 08/21/2019   Migraine 08/21/2019   Mass of joint of right wrist 05/16/2019   Iron deficiency anemia 12/27/2013   Diarrhea 01/15/2013   Dyspepsia 01/15/2013   Joint pain 01/15/2013   Right sided  abdominal pain 01/15/2013    Past Surgical History:  Procedure Laterality Date   MASS EXCISION Right 05/16/2019   Procedure: Excision right wrist volar mass;  Surgeon: Iran Planas, MD;  Location: [facility];  Service: Orthopedics;  Laterality: Right;  with IV sedation   NASAL SEPTUM SURGERY     tissue removed from nose   REFRACTIVE SURGERY     ROTATOR CUFF REPAIR     TONSILLECTOMY      OB History     Gravida  0   Para  0   Term  0   Preterm  0   AB  0   Living  0      SAB  0   IAB  0   Ectopic  0   Multiple  0   Live Births  0            Home Medications    Prior to Admission medications   Medication Sig Start Date End Date Taking? Authorizing Provider  predniSONE (STERAPRED UNI-PAK 21 TAB) 10 MG (21) TBPK tablet Take by mouth daily. As directed 12/28/21  Yes Sharion Balloon, NP  albuterol (VENTOLIN HFA) 108 (90 Base) MCG/ACT inhaler Inhale 1-2 puffs into the lungs every 4 (four) hours as needed for wheezing or shortness of breath. 03/23/20   Harris, Abigail, PA-C  Armodafinil 200 MG TABS One po qAM and 1/2 po qNoon  08/18/21   Sater, Nanine Means, MD  EPINEPHrine (EPIPEN 2-PAK) 0.3 mg/0.3 mL IJ SOAJ injection Inject 0.3 mg into the muscle once as needed for up to 1 dose (for severe allergic reaction). CAll 911 immediately if you have to use this medicine 03/23/20   Margarita Mail, PA-C  Ferrous Fumarate (IRON) 18 MG TBCR Take by mouth.    Joline Salt, RN  levonorgestrel (LILETTA, 52 MG,) 19.5 MCG/DAY IUD IUD 1 each by Intrauterine route once.    [provider]  losartan (COZAAR) 25 MG tablet Take 0.5 tablets (12.5 mg total) by mouth daily. 07/08/21   Caren Macadam, MD  MAGNESIUM PO Take by mouth daily.    [provider]  Multiple Vitamin (MULTIVITAMIN ADULT) TABS Take by mouth.    Joline Salt, RN  Natalizumab (TYSABRI IV) Inject into the vein every 30 (thirty) days.    [provider]  NIFEdipine  (PROCARDIA-XL/NIFEDICAL-XL) 30 MG 24 hr tablet Take 1 tablet (30 mg total) by mouth daily. 08/18/21   Sater, Nanine Means, MD  topiramate (TOPAMAX) 50 MG tablet Take 1-2 tablets (50-100 mg total) by mouth at bedtime. 10/20/21   Farrel Conners, MD  Vitamin D, Cholecalciferol, 25 MCG (1000 UT) TABS Take 1 tablet by mouth daily.    [provider]    Family History Family History  Problem Relation Age of Onset   Diabetes Mother    Hypertension Father    Hyperlipidemia Father    Aortic dissection Father 67   Early death Maternal Grandmother    Aortic dissection Maternal Grandmother 36   Early death Maternal Grandfather    Heart attack Paternal Grandmother    Lupus Sister    Eczema Niece     Social History Social History   Tobacco Use   Smoking status: Never   Smokeless tobacco: Never  Vaping Use   Vaping Use: Never used  Substance Use Topics   Alcohol use: Not Currently   Drug use: Never     Allergies   Bee venom   Review of Systems Review of Systems  Constitutional:  Negative for chills and fever.  Respiratory:  Negative for cough and shortness of breath.   Cardiovascular:  Negative for chest pain and palpitations.  Gastrointestinal:  Negative for abdominal pain, nausea and vomiting.  Musculoskeletal:  Positive for arthralgias. Negative for gait problem and joint swelling.  Skin:  Negative for color change and rash.  All other systems reviewed and are negative.    Physical Exam Triage Vital Signs ED Triage Vitals [12/28/21 1536]  Enc Vitals Group     BP      Pulse      Resp      Temp      Temp src      SpO2      Weight      Height      Head Circumference      Peak Flow      Pain Score 6     Pain Loc      Pain Edu?      Excl. in [place]?    No data found.  Updated Vital Signs BP 122/85   Pulse 97   Temp 97.9 F (36.6 C)   Resp 18   LMP 12/28/2021   SpO2 98%   Visual Acuity Right Eye Distance:   Left Eye Distance:   Bilateral Distance:     Right Eye Near:   Left Eye Near:  Bilateral Near:     Physical Exam Vitals and nursing note reviewed.  Constitutional:      General: She is not in acute distress.    Appearance: Normal appearance. She is well-developed. She is not ill-appearing.  HENT:     Right Ear: Tympanic membrane normal.     Left Ear: Tympanic membrane normal.     Nose: Nose normal.     Mouth/Throat:     Mouth: Mucous membranes are moist.     Pharynx: Oropharynx is clear.  Cardiovascular:     Rate and Rhythm: Normal rate and regular rhythm.     Heart sounds: Normal heart sounds.  Pulmonary:     Effort: Pulmonary effort is normal. No respiratory distress.     Breath sounds: Normal breath sounds.  Musculoskeletal:     Cervical back: Neck supple.  Skin:    General: Skin is warm and dry.  Neurological:     General: No focal deficit present.     Mental Status: She is alert and oriented to person, place, and time.     Sensory: No sensory deficit.     Motor: No weakness.     Gait: Gait normal.  Psychiatric:        Mood and Affect: Mood normal.        Behavior: Behavior normal.      UC Treatments / Results  Labs (all labs ordered are listed, but only abnormal results are displayed) Labs Reviewed - No data to display  EKG   Radiology No results found.  Procedures Procedures (including critical care time)  Medications Ordered in UC Medications - No data to display  Initial Impression / Assessment and Plan / UC Course  I have reviewed the triage vital signs and the nursing notes.  Pertinent labs & imaging results that were available during my care of the patient were reviewed by me and considered in my medical decision making (see chart for details).   Arthralgias, Multiple sclerosis.  Afebrile, vital signs are stable.  Patient is well-appearing and her exam is reassuring.  Treating with prednisone taper as patient reports this has worked well for her in the past. Instructed her to follow  up with her PCP or a neurologist as soon as possible.  Patient agrees to plan of care.     Final Clinical Impressions(s) / UC Diagnoses   Final diagnoses:  Arthralgia, unspecified joint  Multiple sclerosis ([place])     Discharge Instructions      Take the prednisone as directed.  Follow up with your primary care provider or neurologist.        ED Prescriptions     Medication Sig Dispense Auth. Provider   predniSONE (STERAPRED UNI-PAK 21 TAB) 10 MG (21) TBPK tablet Take by mouth daily. As directed 21 tablet Sharion Balloon, NP      PDMP not reviewed this encounter.   Sharion Balloon, NP 12/28/21 [phone]

## 2021-12-29 ENCOUNTER — Telehealth: Payer: Self-pay | Admitting: Neurology

## 2021-12-29 NOTE — Telephone Encounter (Signed)
Referral for Neurology faxed to [facility] of Multiple Sclerosis. Phone: [phone], Fax: [phone]

## 2022-01-27 DIAGNOSIS — Z7689 Persons encountering health services in other specified circumstances: Secondary | ICD-10-CM | POA: Diagnosis not present

## 2022-01-27 DIAGNOSIS — Z79899 Other long term (current) drug therapy: Secondary | ICD-10-CM | POA: Diagnosis not present

## 2022-01-27 DIAGNOSIS — G35 Multiple sclerosis: Secondary | ICD-10-CM | POA: Diagnosis not present

## 2022-01-27 DIAGNOSIS — D849 Immunodeficiency, unspecified: Secondary | ICD-10-CM | POA: Diagnosis not present

## 2022-02-09 DIAGNOSIS — D511 Vitamin B12 deficiency anemia due to selective vitamin B12 malabsorption with proteinuria: Secondary | ICD-10-CM | POA: Diagnosis not present

## 2022-02-09 DIAGNOSIS — D849 Immunodeficiency, unspecified: Secondary | ICD-10-CM | POA: Diagnosis not present

## 2022-02-09 DIAGNOSIS — G35 Multiple sclerosis: Secondary | ICD-10-CM | POA: Diagnosis not present

## 2022-02-11 ENCOUNTER — Telehealth: Payer: Self-pay | Admitting: Family Medicine

## 2022-02-11 NOTE — Telephone Encounter (Signed)
disregard

## 2022-03-02 ENCOUNTER — Ambulatory Visit: Payer: BC Managed Care – PPO | Admitting: Neurology

## 2022-03-23 ENCOUNTER — Ambulatory Visit
Admission: EM | Admit: 2022-03-23 | Discharge: 2022-03-23 | Disposition: A | Discharge status: Home / Self Care | Payer: 59 | Attending: Emergency Medicine | Admitting: Emergency Medicine

## 2022-03-23 DIAGNOSIS — Z793 Long term (current) use of hormonal contraceptives: Secondary | ICD-10-CM | POA: Insufficient documentation

## 2022-03-23 DIAGNOSIS — J029 Acute pharyngitis, unspecified: Secondary | ICD-10-CM | POA: Diagnosis not present

## 2022-03-23 DIAGNOSIS — M545 Low back pain, unspecified: Secondary | ICD-10-CM | POA: Insufficient documentation

## 2022-03-23 DIAGNOSIS — R600 Localized edema: Secondary | ICD-10-CM | POA: Diagnosis not present

## 2022-03-23 DIAGNOSIS — B349 Viral infection, unspecified: Secondary | ICD-10-CM

## 2022-03-23 DIAGNOSIS — R058 Other specified cough: Secondary | ICD-10-CM | POA: Diagnosis not present

## 2022-03-23 DIAGNOSIS — J45909 Unspecified asthma, uncomplicated: Secondary | ICD-10-CM | POA: Insufficient documentation

## 2022-03-23 DIAGNOSIS — M2550 Pain in unspecified joint: Secondary | ICD-10-CM | POA: Insufficient documentation

## 2022-03-23 DIAGNOSIS — G4733 Obstructive sleep apnea (adult) (pediatric): Secondary | ICD-10-CM | POA: Insufficient documentation

## 2022-03-23 DIAGNOSIS — Z7952 Long term (current) use of systemic steroids: Secondary | ICD-10-CM | POA: Diagnosis not present

## 2022-03-23 DIAGNOSIS — Z1152 Encounter for screening for COVID-19: Secondary | ICD-10-CM | POA: Diagnosis not present

## 2022-03-23 DIAGNOSIS — G43909 Migraine, unspecified, not intractable, without status migrainosus: Secondary | ICD-10-CM | POA: Diagnosis not present

## 2022-03-23 DIAGNOSIS — Z79899 Other long term (current) drug therapy: Secondary | ICD-10-CM | POA: Insufficient documentation

## 2022-03-23 DIAGNOSIS — Z975 Presence of (intrauterine) contraceptive device: Secondary | ICD-10-CM | POA: Insufficient documentation

## 2022-03-23 DIAGNOSIS — G35 Multiple sclerosis: Secondary | ICD-10-CM | POA: Diagnosis not present

## 2022-03-23 LAB — POCT RAPID STREP A (OFFICE): Rapid Strep A Screen: NEGATIVE

## 2022-03-23 NOTE — ED Triage Notes (Addendum)
Patient to Urgent Care with complaints of generalized body aches, sore throat, headaches, and breathing issue that started today.  Denies any known fevers but does report chills.   Reports recently stopping all of her MS medications (Natalizumab) for the last month d/t elevated live enzymes.

## 2022-03-23 NOTE — ED Provider Notes (Signed)
[facility]    CSN: [id number] Arrival date & time: 03/23/22  1744      History   Chief Complaint Chief Complaint  Patient presents with   Generalized Body Aches    HPI Kaylee Cowan is a 42 y.o. female.  Patient presents with chills, body aches, headaches, sore throat, mild cough since this morning.  No fever, chest pain, shortness of breath, wheezing, or other symptoms.  She has not needed to use her albuterol inhaler.  No OTC medications taken.  Patient was seen here on 12/28/2021; diagnosed with arthralgias and multiple sclerosis; treated with prednisone taper.  Her medical history includes multiple sclerosis, migraine headaches, asthma, sleep apnea, lower extremity edema, peripheral vasoconstriction, hypersomnia, sleep disorder, giant cell tumor, low back pain, urge incontinence.   The history is provided by the patient and medical records.    Past Medical History:  Diagnosis Date   Anemia    Asthma    Giant cell tumor    Hypersomnolence    Migraines    Sleep disorder     Patient Active Problem List   Diagnosis Date Noted   Multiple sclerosis ([place]) 10/19/2020   High risk medication use 10/19/2020   Urge incontinence 08/20/2020   Lhermitte's sign positive 08/20/2020   Numbness 08/20/2020   Presence of intrauterine contraceptive device (IUD) 11/28/2019   Encounter for administration of vaccine 11/28/2019   Low back pain 11/20/2019   Mild obstructive sleep apnea 11/08/2019   Excessive daytime sleepiness 11/08/2019   Encounter for removal and reinsertion of intrauterine contraceptive device (IUD) 10/18/2019   Women's annual routine gynecological examination 10/18/2019   Sleep disorder    Hypersomnia with sleep apnea    Asthma    Giant cell tumor    Bilateral leg edema 09/12/2019   Bilateral leg numbness 09/12/2019   Lower extremity edema 08/21/2019   Peripheral vasoconstriction ([facility]) 08/21/2019   Idiopathic hypersomnia 08/21/2019   Migraine  08/21/2019   Mass of joint of right wrist 05/16/2019   Iron deficiency anemia 12/27/2013   Diarrhea 01/15/2013   Dyspepsia 01/15/2013   Joint pain 01/15/2013   Right sided abdominal pain 01/15/2013    Past Surgical History:  Procedure Laterality Date   MASS EXCISION Right 05/16/2019   Procedure: Excision right wrist volar mass;  Surgeon: Iran Planas, MD;  Location: [place];  Service: Orthopedics;  Laterality: Right;  with IV sedation   NASAL SEPTUM SURGERY     tissue removed from nose   REFRACTIVE SURGERY     ROTATOR CUFF REPAIR     TONSILLECTOMY      OB History     Gravida  0   Para  0   Term  0   Preterm  0   AB  0   Living  0      SAB  0   IAB  0   Ectopic  0   Multiple  0   Live Births  0            Home Medications    Prior to Admission medications   Medication Sig Start Date End Date Taking? Authorizing Provider  albuterol (VENTOLIN HFA) 108 (90 Base) MCG/ACT inhaler Inhale 1-2 puffs into the lungs every 4 (four) hours as needed for wheezing or shortness of breath. 03/23/20   Margarita Mail, PA-C  Armodafinil 200 MG TABS One po qAM and 1/2 po qNoon 08/18/21   Sater, Nanine Means, MD  EPINEPHrine [facility]  2-PAK) 0.3 mg/0.3 mL IJ SOAJ injection Inject 0.3 mg into the muscle once as needed for up to 1 dose (for severe allergic reaction). CAll 911 immediately if you have to use this medicine 03/23/20   Margarita Mail, PA-C  Ferrous Fumarate (IRON) 18 MG TBCR Take by mouth.    Joline Salt, RN  levonorgestrel (LILETTA, 52 MG,) 19.5 MCG/DAY IUD IUD 1 each by Intrauterine route once.    [provider]  losartan (COZAAR) 25 MG tablet Take 0.5 tablets (12.5 mg total) by mouth daily. 07/08/21   Caren Macadam, MD  MAGNESIUM PO Take by mouth daily.    [provider]  Multiple Vitamin (MULTIVITAMIN ADULT) TABS Take by mouth.    Joline Salt, RN  Natalizumab (TYSABRI IV) Inject into the vein every 30 (thirty) days.     [provider]  NIFEdipine (PROCARDIA-XL/NIFEDICAL-XL) 30 MG 24 hr tablet Take 1 tablet (30 mg total) by mouth daily. 08/18/21   Sater, Nanine Means, MD  predniSONE (STERAPRED UNI-PAK 21 TAB) 10 MG (21) TBPK tablet Take by mouth daily. As directed 12/28/21   Sharion Balloon, NP  topiramate (TOPAMAX) 50 MG tablet Take 1-2 tablets (50-100 mg total) by mouth at bedtime. 10/20/21   Farrel Conners, MD  Vitamin D, Cholecalciferol, 25 MCG (1000 UT) TABS Take 1 tablet by mouth daily.    [provider]    Family History Family History  Problem Relation Age of Onset   Diabetes Mother    Hypertension Father    Hyperlipidemia Father    Aortic dissection Father 68   Early death Maternal Grandmother    Aortic dissection Maternal Grandmother 9   Early death Maternal Grandfather    Heart attack Paternal Grandmother    Lupus Sister    Eczema Niece     Social History Social History   Tobacco Use   Smoking status: Never   Smokeless tobacco: Never  Vaping Use   Vaping Use: Never used  Substance Use Topics   Alcohol use: Not Currently   Drug use: Never     Allergies   Bee venom   Review of Systems Review of Systems  Constitutional:  Positive for chills. Negative for fever.  HENT:  Positive for sore throat. Negative for ear pain.   Respiratory:  Positive for cough. Negative for shortness of breath and wheezing.   Cardiovascular:  Negative for chest pain and palpitations.  Gastrointestinal:  Negative for abdominal pain, diarrhea and vomiting.  Skin:  Negative for color change and rash.  Neurological:  Positive for headaches.  All other systems reviewed and are negative.    Physical Exam Triage Vital Signs ED Triage Vitals  Enc Vitals Group     BP      Pulse      Resp      Temp      Temp src      SpO2      Weight      Height      Head Circumference      Peak Flow      Pain Score      Pain Loc      Pain Edu?      Excl. in [place]?    No data found.  Updated  Vital Signs BP 123/84   Pulse (!) 101   Temp 99.2 F (37.3 C)   Resp 18   Ht '5\' 5"'$  (1.651 m)   Wt 245 lb (111.1  kg)   SpO2 98%   BMI 40.77 kg/m   Visual Acuity Right Eye Distance:   Left Eye Distance:   Bilateral Distance:    Right Eye Near:   Left Eye Near:    Bilateral Near:     Physical Exam Vitals and nursing note reviewed.  Constitutional:      General: She is not in acute distress.    Appearance: She is well-developed. She is not ill-appearing.  HENT:     Right Ear: Tympanic membrane normal.     Left Ear: Tympanic membrane normal.     Nose: Nose normal.     Mouth/Throat:     Mouth: Mucous membranes are moist.     Pharynx: Oropharynx is clear.  Cardiovascular:     Rate and Rhythm: Normal rate and regular rhythm.     Heart sounds: Normal heart sounds.  Pulmonary:     Effort: Pulmonary effort is normal. No respiratory distress.     Breath sounds: Normal breath sounds. No wheezing.  Musculoskeletal:     Cervical back: Neck supple.  Skin:    General: Skin is warm and dry.  Neurological:     Mental Status: She is alert.  Psychiatric:        Mood and Affect: Mood normal.        Behavior: Behavior normal.      UC Treatments / Results  Labs (all labs ordered are listed, but only abnormal results are displayed) Labs Reviewed  SARS CORONAVIRUS 2 (TAT 6-24 HRS)  POCT RAPID STREP A (OFFICE)    EKG   Radiology No results found.  Procedures Procedures (including critical care time)  Medications Ordered in UC Medications - No data to display  Initial Impression / Assessment and Plan / UC Course  I have reviewed the triage vital signs and the nursing notes.  Pertinent labs & imaging results that were available during my care of the patient were reviewed by me and considered in my medical decision making (see chart for details).   Viral illness.  Rapid strep negative.  COVID pending.  Discussed symptomatic treatment including Tylenol, rest, hydration.   Instructed patient to follow up with her PCP if her symptoms are not improving.  Education provided on viral illness.  She agrees to plan of care.    Final Clinical Impressions(s) / UC Diagnoses   Final diagnoses:  Viral illness     Discharge Instructions      Your strep test is negative.  Your COVID test is pending.    Take Tylenol as needed for fever or discomfort.  Rest and keep yourself hydrated.    Follow-up with your primary care provider if your symptoms are not improving.         ED Prescriptions   None    PDMP not reviewed this encounter.   Sharion Balloon, NP 03/23/22 [phone]

## 2022-03-23 NOTE — Discharge Instructions (Addendum)
Your strep test is negative.  Your COVID test is pending.    Take Tylenol as needed for fever or discomfort.  Rest and keep yourself hydrated.    Follow-up with your primary care provider if your symptoms are not improving.     

## 2022-03-24 LAB — SARS CORONAVIRUS 2 (TAT 6-24 HRS): SARS Coronavirus 2: NEGATIVE

## 2022-04-06 ENCOUNTER — Other Ambulatory Visit: Payer: Self-pay | Admitting: Neurology

## 2022-04-07 NOTE — Telephone Encounter (Signed)
Per Drug register last filled 10/04/21 # 45 tablets  Last seen 08/18/21

## 2022-05-02 ENCOUNTER — Emergency Department (HOSPITAL_BASED_OUTPATIENT_CLINIC_OR_DEPARTMENT_OTHER)
Admission: EM | Admit: 2022-05-02 | Discharge: 2022-05-02 | Disposition: A | Discharge status: Home / Self Care | Payer: 59 | Attending: Emergency Medicine | Admitting: Emergency Medicine

## 2022-05-02 ENCOUNTER — Emergency Department (HOSPITAL_BASED_OUTPATIENT_CLINIC_OR_DEPARTMENT_OTHER): Payer: 59 | Admitting: Radiology

## 2022-05-02 DIAGNOSIS — R052 Subacute cough: Secondary | ICD-10-CM | POA: Insufficient documentation

## 2022-05-02 DIAGNOSIS — Z1152 Encounter for screening for COVID-19: Secondary | ICD-10-CM | POA: Insufficient documentation

## 2022-05-02 LAB — RESP PANEL BY RT-PCR (RSV, FLU A&B, COVID)  RVPGX2
Influenza A by PCR: NEGATIVE
Influenza B by PCR: NEGATIVE
Resp Syncytial Virus by PCR: NEGATIVE
SARS Coronavirus 2 by RT PCR: NEGATIVE

## 2022-05-02 MED ORDER — ALBUTEROL SULFATE HFA 108 (90 BASE) MCG/ACT IN AERS
1.0000 | INHALATION_SPRAY | RESPIRATORY_TRACT | Status: DC | PRN
  Administered 2022-05-02: 2 via RESPIRATORY_TRACT
  Filled 2022-05-02: qty 6.7

## 2022-05-02 MED ORDER — ALBUTEROL SULFATE HFA 108 (90 BASE) MCG/ACT IN AERS: 1.0000 | INHALATION_SPRAY | Freq: Four times a day (QID) | RESPIRATORY_TRACT | 2 refills | Status: AC | PRN

## 2022-05-02 MED ORDER — BENZONATATE 100 MG PO CAPS: 100.0000 mg | ORAL_CAPSULE | Freq: Three times a day (TID) | ORAL | 0 refills | Status: AC

## 2022-05-02 MED ORDER — PREDNISONE 50 MG PO TABS
60.0000 mg | ORAL_TABLET | Freq: Once | ORAL | Status: AC
  Administered 2022-05-02: 60 mg via ORAL
  Filled 2022-05-02: qty 1

## 2022-05-02 MED ORDER — PREDNISONE 10 MG PO TABS: 40.0000 mg | ORAL_TABLET | Freq: Every day | ORAL | 0 refills | Status: AC

## 2022-05-02 NOTE — Discharge Instructions (Addendum)
Note the workup today was overall reassuring.  X-ray was without abnormality.  You tested negative for COVID, flu, RSV.  Will treat for cough variant asthma with albuterol inhaler, prednisone as well as cough suppressant to use as needed.  As discussed, could be related to GERD if you would like to start famotidine or Pepcid over-the-counter.  Recommend reevaluation by primary care for reassessment of symptoms.  Please do not hesitate to return to emergency department for worrisome signs and symptoms we discussed become apparent.

## 2022-05-02 NOTE — ED Triage Notes (Signed)
Pt states that, "7wks ago, states I was sick w something," states she has had persistent dry cough since. Advises inhaler helps "temporarily," but she is out of inhaler. States she is immunocompromised & concerned r/t hx MS, asthma

## 2022-05-02 NOTE — ED Provider Notes (Signed)
[facility] Provider Note   CSN: [id number] Arrival date & time: 05/02/22  1529     History  Chief Complaint  Patient presents with   Cough    Kaylee Cowan is a 42 y.o. female.   Cough   42 year old female presents emergency department with complaints of cough.  Patient states she has been with cough since early January.  Was then diagnosed with upper respiratory viral infection with fever, nasal congestion.  She reports other symptoms have resolved and is left with persistent cough.  Has been taking at home cold and flu medicine as well as Tylenol/Motrin which has not helped.  She does state that she has been using her at home albuterol inhaler more which relieves symptoms for about a few hours before returns.  Denies fever, chills, night sweats, chest pain, shortness of breath, abdominal pain, nausea, vomiting, urinary symptoms, change in bowel habits.  Describes cough as dry in nature and is keeping her up at night.  Denies cigarette use or known malignancy.  Past medical history significant for MS, low back pain, migraine, giant cell tumor  Home Medications Prior to Admission medications   Medication Sig Start Date End Date Taking? Authorizing Provider  albuterol (VENTOLIN HFA) 108 (90 Base) MCG/ACT inhaler Inhale 1-2 puffs into the lungs every 6 (six) hours as needed for wheezing or shortness of breath. 05/02/22  Yes Dion Saucier A, PA  benzonatate (TESSALON) 100 MG capsule Take 1 capsule (100 mg total) by mouth every 8 (eight) hours. 05/02/22  Yes Dion Saucier A, PA  predniSONE (DELTASONE) 10 MG tablet Take 4 tablets (40 mg total) by mouth daily for 5 days. 05/03/22 05/08/22 Yes Shadi Larner A, PA  Armodafinil 200 MG TABS TAKE ONE TABLET BY MOUTH IN THE MORNING AND ONE-HALF TABLET BY MOUTH AT NOON 04/07/22   Sater, Nanine Means, MD  EPINEPHrine (EPIPEN 2-PAK) 0.3 mg/0.3 mL IJ SOAJ injection Inject 0.3 mg into the muscle once  as needed for up to 1 dose (for severe allergic reaction). CAll 911 immediately if you have to use this medicine 03/23/20   Margarita Mail, PA-C  Ferrous Fumarate (IRON) 18 MG TBCR Take by mouth.    Joline Salt, RN  levonorgestrel (LILETTA, 52 MG,) 19.5 MCG/DAY IUD IUD 1 each by Intrauterine route once.    [provider]  losartan (COZAAR) 25 MG tablet Take 0.5 tablets (12.5 mg total) by mouth daily. 07/08/21   Caren Macadam, MD  MAGNESIUM PO Take by mouth daily.    [provider]  Multiple Vitamin (MULTIVITAMIN ADULT) TABS Take by mouth.    Joline Salt, RN  Natalizumab (TYSABRI IV) Inject into the vein every 30 (thirty) days.    [provider]  NIFEdipine (PROCARDIA-XL/NIFEDICAL-XL) 30 MG 24 hr tablet Take 1 tablet (30 mg total) by mouth daily. 08/18/21   Sater, Nanine Means, MD  topiramate (TOPAMAX) 50 MG tablet Take 1-2 tablets (50-100 mg total) by mouth at bedtime. 10/20/21   Farrel Conners, MD  Vitamin D, Cholecalciferol, 25 MCG (1000 UT) TABS Take 1 tablet by mouth daily.    [provider]      Allergies    Bee venom    Review of Systems   Review of Systems  Respiratory:  Positive for cough.   All other systems reviewed and are negative.   Physical Exam Updated Vital Signs BP 138/82 (BP Location: Right Arm)   Pulse 96  Temp 98.2 F (36.8 C) (Oral)   Resp 20   SpO2 100%  Physical Exam Vitals and nursing note reviewed.  Constitutional:      General: She is not in acute distress.    Appearance: She is well-developed.  HENT:     Head: Normocephalic and atraumatic.  Eyes:     Conjunctiva/sclera: Conjunctivae normal.  Cardiovascular:     Rate and Rhythm: Normal rate and regular rhythm.     Heart sounds: No murmur heard. Pulmonary:     Effort: Pulmonary effort is normal. No respiratory distress.     Breath sounds: Normal breath sounds. No wheezing, rhonchi or rales.  Abdominal:     Palpations: Abdomen is soft.      Tenderness: There is no abdominal tenderness.  Musculoskeletal:        General: No swelling.     Cervical back: Neck supple.  Skin:    General: Skin is warm and dry.     Capillary Refill: Capillary refill takes less than 2 seconds.  Neurological:     Mental Status: She is alert.  Psychiatric:        Mood and Affect: Mood normal.     ED Results / Procedures / Treatments   Labs (all labs ordered are listed, but only abnormal results are displayed) Labs Reviewed  RESP PANEL BY RT-PCR (RSV, FLU A&B, COVID)  RVPGX2    EKG None  Radiology DG Chest 2 View  Result Date: 05/02/2022 CLINICAL DATA:  C/o persistent cough and SOB since 03/16/22. Hx MS (on meds that make her immunocompromised), asthma EXAM: CHEST - 2 VIEW COMPARISON:  03/23/2020 FINDINGS: Lungs are clear. Heart size and mediastinal contours are within normal limits. No effusion. Visualized bones unremarkable. IMPRESSION: No acute cardiopulmonary disease. Electronically Signed   By: Lucrezia Europe M.D.   On: 05/02/2022 16:35    Procedures Procedures    Medications Ordered in ED Medications  predniSONE (DELTASONE) tablet 60 mg (has no administration in time range)  albuterol (VENTOLIN HFA) 108 (90 Base) MCG/ACT inhaler 1-2 puff (has no administration in time range)    ED Course/ Medical Decision Making/ A&P                             Medical Decision Making Amount and/or Complexity of Data Reviewed Radiology: ordered.   This patient presents to the ED for concern of cough, this involves an extensive number of treatment options, and is a complaint that carries with it a high risk of complications and morbidity.  The differential diagnosis includes postviral cough syndrome, malignancy, pneumonia, upper respiratory viral infection, GERD, medication side effect, cough variant asthma   Co morbidities that complicate the patient evaluation  See HPI   Additional history obtained:  Additional history obtained from  EMR External records from outside source obtained and reviewed including hospital records   Lab Tests:  I Ordered, and personally interpreted labs.  The pertinent results include: Respiratory viral panel negative   Imaging Studies ordered:  I ordered imaging studies including chest x-ray I independently visualized and interpreted imaging which showed no acute cardiopulmonary abnormalities I agree with the radiologist interpretation   Cardiac Monitoring: / EKG:  The patient was maintained on a cardiac monitor.  I personally viewed and interpreted the cardiac monitored which showed an underlying rhythm of: Sinus rhythm   Consultations Obtained:  N/a   Problem List / ED Course / Critical interventions / Medication  management  Cough I ordered medication including prednisone, albuterol Reevaluation of the patient after these medicines showed that the patient improved I have reviewed the patients home medicines and have made adjustments as needed   Social Determinants of Health:  Denies tobacco, illicit drug use   Test / Admission - Considered:  Cough Vitals signs within normal range and stable throughout visit. Laboratory/imaging studies significant for: See above Patient with evidence of cough with negative chest x-ray or systemic illness concerning for overt bacterial infection.  Some concern for GERD versus cough variant asthma.  Given significant improvement of cough with albuterol inhaler, more suspicious for cough variant asthma.  Low suspicion for malignancy or medication side effect given patient is not on an ACE or an ARB.  Will send in refill of albuterol inhalers as well as prednisone and cough suppressant to use as needed.  Follow-up recommended with primary care for reassessment of symptoms.  Treatment plan discussed at length with patient and she acknowledged understanding was agreeable to said plan. Worrisome signs and symptoms were discussed with the patient,  and the patient acknowledged understanding to return to the ED if noticed. Patient was stable upon discharge.          Final Clinical Impression(s) / ED Diagnoses Final diagnoses:  Subacute cough    Rx / DC Orders ED Discharge Orders          Ordered    predniSONE (DELTASONE) 10 MG tablet  Daily        05/02/22 1702    albuterol (VENTOLIN HFA) 108 (90 Base) MCG/ACT inhaler  Every 6 hours PRN        05/02/22 1702    benzonatate (TESSALON) 100 MG capsule  Every 8 hours        05/02/22 1703              Wilnette Kales, PA 05/02/22 1703    Deno Etienne, DO 05/02/22 1922

## 2022-06-19 ENCOUNTER — Other Ambulatory Visit: Payer: Self-pay

## 2022-06-19 ENCOUNTER — Emergency Department (HOSPITAL_BASED_OUTPATIENT_CLINIC_OR_DEPARTMENT_OTHER): Payer: 59

## 2022-06-19 ENCOUNTER — Emergency Department (HOSPITAL_BASED_OUTPATIENT_CLINIC_OR_DEPARTMENT_OTHER)
Admission: EM | Admit: 2022-06-19 | Discharge: 2022-06-20 | Disposition: A | Discharge status: Home / Self Care | Payer: 59 | Attending: Emergency Medicine | Admitting: Emergency Medicine

## 2022-06-19 DIAGNOSIS — R1010 Upper abdominal pain, unspecified: Secondary | ICD-10-CM | POA: Diagnosis present

## 2022-06-19 DIAGNOSIS — R109 Unspecified abdominal pain: Secondary | ICD-10-CM

## 2022-06-19 LAB — PREGNANCY, URINE: Preg Test, Ur: NEGATIVE

## 2022-06-19 LAB — CBC
HCT: 39 % (ref 36.0–46.0)
Hemoglobin: 12.6 g/dL (ref 12.0–15.0)
MCH: 27.1 pg (ref 26.0–34.0)
MCHC: 32.3 g/dL (ref 30.0–36.0)
MCV: 83.9 fL (ref 80.0–100.0)
Platelets: 459 10*3/uL — ABNORMAL HIGH (ref 150–400)
RBC: 4.65 MIL/uL (ref 3.87–5.11)
RDW: 14.9 % (ref 11.5–15.5)
WBC: 12.9 10*3/uL — ABNORMAL HIGH (ref 4.0–10.5)
nRBC: 0 % (ref 0.0–0.2)

## 2022-06-19 LAB — URINALYSIS, ROUTINE W REFLEX MICROSCOPIC
Bilirubin Urine: NEGATIVE
Glucose, UA: NEGATIVE mg/dL
Ketones, ur: NEGATIVE mg/dL
Leukocytes,Ua: NEGATIVE
Nitrite: NEGATIVE
Specific Gravity, Urine: 1.008 (ref 1.005–1.030)
pH: 5 (ref 5.0–8.0)

## 2022-06-19 LAB — LIPASE, BLOOD: Lipase: 11 U/L (ref 11–51)

## 2022-06-19 LAB — COMPREHENSIVE METABOLIC PANEL
ALT: 10 U/L (ref 0–44)
AST: 13 U/L — ABNORMAL LOW (ref 15–41)
Albumin: 4.4 g/dL (ref 3.5–5.0)
Alkaline Phosphatase: 83 U/L (ref 38–126)
Anion gap: 11 (ref 5–15)
BUN: 16 mg/dL (ref 6–20)
CO2: 23 mmol/L (ref 22–32)
Calcium: 10.8 mg/dL — ABNORMAL HIGH (ref 8.9–10.3)
Chloride: 106 mmol/L (ref 98–111)
Creatinine, Ser: 0.97 mg/dL (ref 0.44–1.00)
GFR, Estimated: >60 mL/min (ref >60)
Glucose, Bld: 98 mg/dL (ref 70–99)
Potassium: 3.9 mmol/L (ref 3.5–5.1)
Sodium: 140 mmol/L (ref 135–145)
Total Bilirubin: 0.3 mg/dL (ref 0.3–1.2)
Total Protein: 8 g/dL (ref 6.5–8.1)

## 2022-06-19 MED ORDER — SODIUM CHLORIDE 0.9 % IV BOLUS
1000.0000 mL | Freq: Once | INTRAVENOUS | Status: AC
  Administered 2022-06-20: 1000 mL via INTRAVENOUS

## 2022-06-19 MED ORDER — KETOROLAC TROMETHAMINE 30 MG/ML IJ SOLN
30.0000 mg | Freq: Once | INTRAMUSCULAR | Status: AC
  Administered 2022-06-20: 30 mg via INTRAVENOUS
  Filled 2022-06-19: qty 1

## 2022-06-19 NOTE — ED Triage Notes (Signed)
To ED with c/o pain to R flank radiating to abd with onset 04/28/22. Reports worsening over the last two days. Also reports N/V/D.

## 2022-06-19 NOTE — ED Provider Notes (Signed)
[facility] EMERGENCY DEPARTMENT AT [facility] Provider Note   CSN: [id number] Arrival date & time: 06/19/22  2231     History  Chief Complaint  Patient presents with   Abdominal Pain    Kaylee Cowan is a 42 y.o. female.  Patient is a 42 year old female with past medical history of multiple sclerosis receiving Tysabri injections since January.  After her first treatment, patient states that she began to develop some upper abdominal discomfort that was thought to be reflux.  She has been taking an acid medication since.  Over the past 2 days, her pain is now more localized to the right side of her abdomen and has become more severe.  She denies that she is having any fevers or chills.  She denies any diarrhea or constipation.  She denies any urinary complaints.  Pain is worse with movement and palpation.  No alleviating factors.  The history is provided by the patient.       Home Medications Prior to Admission medications   Medication Sig Start Date End Date Taking? Authorizing Provider  albuterol (VENTOLIN HFA) 108 (90 Base) MCG/ACT inhaler Inhale 1-2 puffs into the lungs every 6 (six) hours as needed for wheezing or shortness of breath. 05/02/22   Sherian Maroon A, PA  Armodafinil 200 MG TABS TAKE ONE TABLET BY MOUTH IN THE MORNING AND ONE-HALF TABLET BY MOUTH AT NOON 04/07/22   Sater, Pearletha Furl, MD  benzonatate (TESSALON) 100 MG capsule Take 1 capsule (100 mg total) by mouth every 8 (eight) hours. 05/02/22   Peter Garter, PA  EPINEPHrine (EPIPEN 2-PAK) 0.3 mg/0.3 mL IJ SOAJ injection Inject 0.3 mg into the muscle once as needed for up to 1 dose (for severe allergic reaction). CAll 911 immediately if you have to use this medicine 03/23/20   Arthor Captain, PA-C  Ferrous Fumarate (IRON) 18 MG TBCR Take by mouth.    Alver Fisher, RN  levonorgestrel (LILETTA, 52 MG,) 19.5 MCG/DAY IUD IUD 1 each by Intrauterine route once.    [provider]  losartan  (COZAAR) 25 MG tablet Take 0.5 tablets (12.5 mg total) by mouth daily. 07/08/21   Wynn Banker, MD  MAGNESIUM PO Take by mouth daily.    [provider]  Multiple Vitamin (MULTIVITAMIN ADULT) TABS Take by mouth.    Alver Fisher, RN  Natalizumab (TYSABRI IV) Inject into the vein every 30 (thirty) days.    [provider]  NIFEdipine (PROCARDIA-XL/NIFEDICAL-XL) 30 MG 24 hr tablet Take 1 tablet (30 mg total) by mouth daily. 08/18/21   Sater, Pearletha Furl, MD  topiramate (TOPAMAX) 50 MG tablet Take 1-2 tablets (50-100 mg total) by mouth at bedtime. 10/20/21   Karie Georges, MD  Vitamin D, Cholecalciferol, 25 MCG (1000 UT) TABS Take 1 tablet by mouth daily.    [provider]      Allergies    Bee venom    Review of Systems   Review of Systems  All other systems reviewed and are negative.   Physical Exam Updated Vital Signs BP (!) 155/106   Pulse (!) 108   Temp 97.8 F (36.6 C) (Oral)   Resp 18   Ht 5\' 5"  (1.651 m)   Wt 108.4 kg   SpO2 100%   BMI 39.77 kg/m  Physical Exam Vitals and nursing note reviewed.  Constitutional:      General: She is not in acute distress.    Appearance: She is  well-developed. She is not diaphoretic.  HENT:     Head: Normocephalic and atraumatic.  Cardiovascular:     Rate and Rhythm: Normal rate and regular rhythm.     Heart sounds: No murmur heard.    No friction rub. No gallop.  Pulmonary:     Effort: Pulmonary effort is normal. No respiratory distress.     Breath sounds: Normal breath sounds. No wheezing.  Abdominal:     General: Bowel sounds are normal. There is no distension.     Palpations: Abdomen is soft.     Tenderness: There is abdominal tenderness in the right upper quadrant and right lower quadrant. There is no right CVA tenderness, left CVA tenderness, guarding or rebound.  Musculoskeletal:        General: Normal range of motion.     Cervical back: Normal range of motion and neck supple.  Skin:     General: Skin is warm and dry.  Neurological:     General: No focal deficit present.     Mental Status: She is alert and oriented to person, place, and time.     ED Results / Procedures / Treatments   Labs (all labs ordered are listed, but only abnormal results are displayed) Labs Reviewed  COMPREHENSIVE METABOLIC PANEL - Abnormal; Notable for the following components:      Result Value   Calcium 10.8 (*)    AST 13 (*)    All other components within normal limits  CBC - Abnormal; Notable for the following components:   WBC 12.9 (*)    Platelets 459 (*)    All other components within normal limits  URINALYSIS, ROUTINE W REFLEX MICROSCOPIC - Abnormal; Notable for the following components:   Color, Urine COLORLESS (*)    Hgb urine dipstick TRACE (*)    Protein, ur TRACE (*)    Bacteria, UA RARE (*)    All other components within normal limits  LIPASE, BLOOD  PREGNANCY, URINE    EKG None  Radiology No results found.  Procedures Procedures  {Document cardiac monitor, telemetry assessment procedure when appropriate:1}  Medications Ordered in ED Medications  sodium chloride 0.9 % bolus 1,000 mL (has no administration in time range)  ketorolac (TORADOL) 30 MG/ML injection 30 mg (has no administration in time range)    ED Course/ Medical Decision Making/ A&P   {   Click here for ABCD2, HEART and other calculatorsREFRESH Note before signing :1}                          Medical Decision Making Amount and/or Complexity of Data Reviewed Labs: ordered. Radiology: ordered.  Risk Prescription drug management.   ***  {Document critical care time when appropriate:1} {Document review of labs and clinical decision tools ie heart score, Chads2Vasc2 etc:1}  {Document your independent review of radiology images, and any outside records:1} {Document your discussion with family members, caretakers, and with consultants:1} {Document social determinants of health affecting pt's  care:1} {Document your decision making why or why not admission, treatments were needed:1} Final Clinical Impression(s) / ED Diagnoses Final diagnoses:  None    Rx / DC Orders ED Discharge Orders     None

## 2022-06-20 MED ORDER — HYDROCODONE-ACETAMINOPHEN 5-325 MG PO TABS: 1.0000 | ORAL_TABLET | Freq: Four times a day (QID) | ORAL | 0 refills | Status: AC | PRN

## 2022-06-20 MED ORDER — IOHEXOL 300 MG/ML  SOLN
100.0000 mL | Freq: Once | INTRAMUSCULAR | Status: AC | PRN
  Administered 2022-06-20: 100 mL via INTRAVENOUS

## 2022-06-20 NOTE — Discharge Instructions (Addendum)
Take ibuprofen 600 mg every 6 hours as needed for pain.  Begin taking hydrocodone as prescribed as needed for pain not relieved with ibuprofen.  Follow-up with your rheumatologist this week if symptoms are not improving, and return to the ER if you develop worsening abdominal pain, high fevers, bloody stools, or for other new and concerning symptoms.

## 2022-12-01 IMAGING — MR MR CERVICAL SPINE WO/W CM
7 of 8 series · 36 of 48 positions shown · IV contrast (gadavist)
Comparison: None.

CLINICAL DATA: Numbness.  Davila sign.

EXAM:
MRI CERVICAL SPINE WITHOUT AND WITH CONTRAST
TECHNIQUE: Multiplanar and multiecho pulse sequences of the cervical spine, to
include the craniocervical junction and cervicothoracic junction,
were obtained without and with intravenous contrast.
CONTRAST:  10mL GADAVIST GADOBUTROL 1 MMOL/ML IV SOLN

[Series 5: T1 · sagittal · 3.0mm · 0.69mm/px · 5 of 19 slices shown (1 of 2)]
[im 1/19]
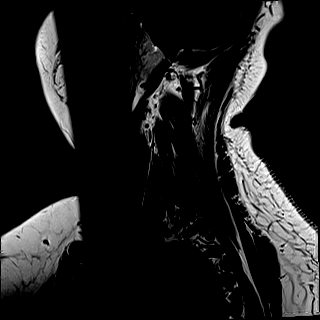
[im 5/19]
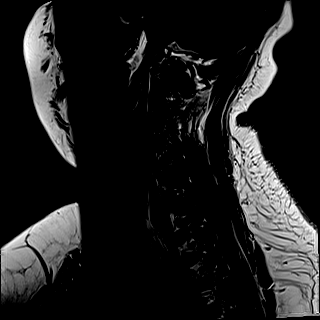
[im 10/19]
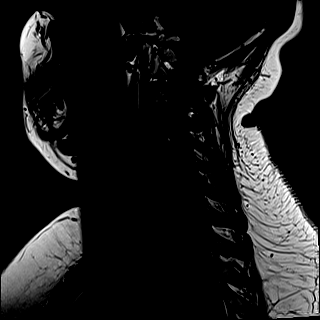
[im 14/19]
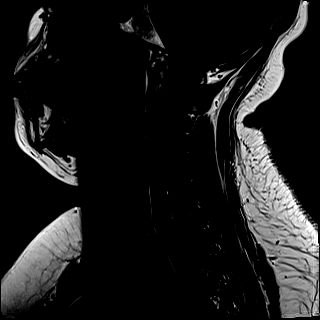
[im 19/19]
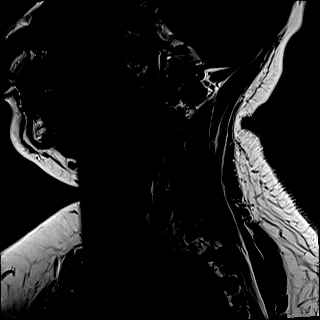

[Series 6: STIR · sagittal · 3.0mm · 0.86mm/px · 5 of 19 slices shown]
[im 1/19]
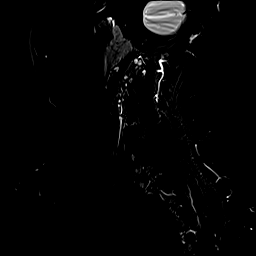
[im 5/19]
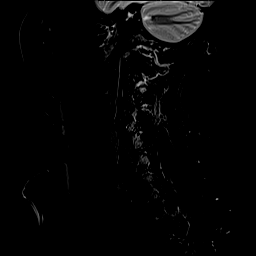
[im 10/19]
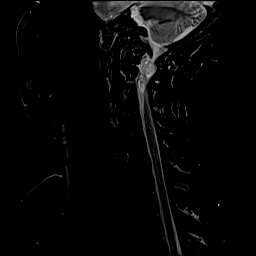
[im 14/19]
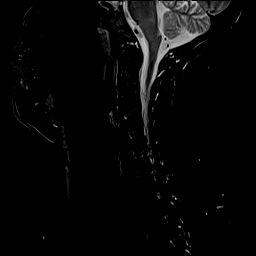
[im 19/19]
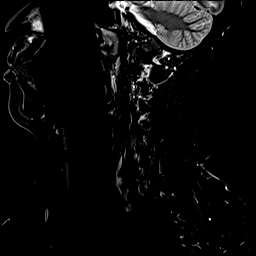

[Series 7: T2 · axial · 3.0mm · 0.78mm/px · z∈[-21,+79]mm · 7 of 31 slices shown]
[im 1/31]
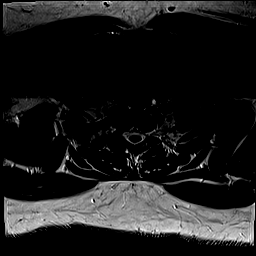
[im 6/31]
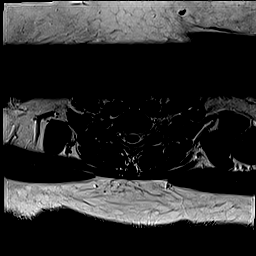
[im 11/31]
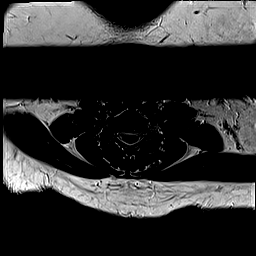
[im 16/31]
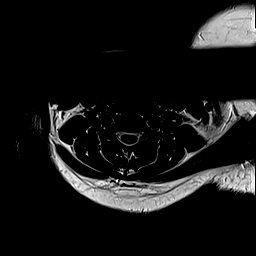
[im 21/31]
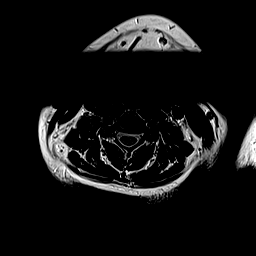
[im 26/31]
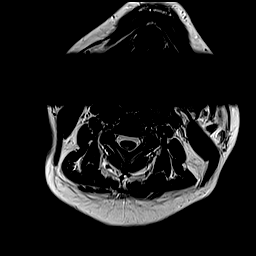
[im 31/31]
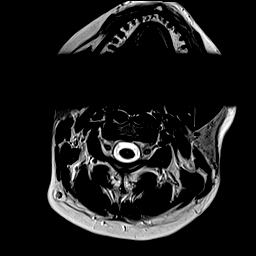

[Series 9: T1 · axial · 3.0mm · 0.39mm/px · z∈[-21,+79]mm · 7 of 31 slices shown (2 of 2)]
[im 1/31]
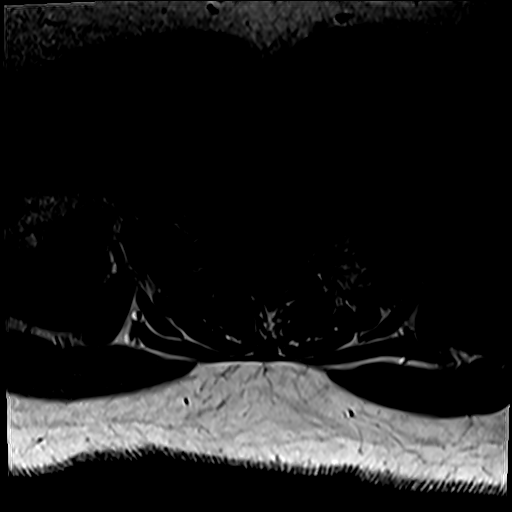
[im 6/31]
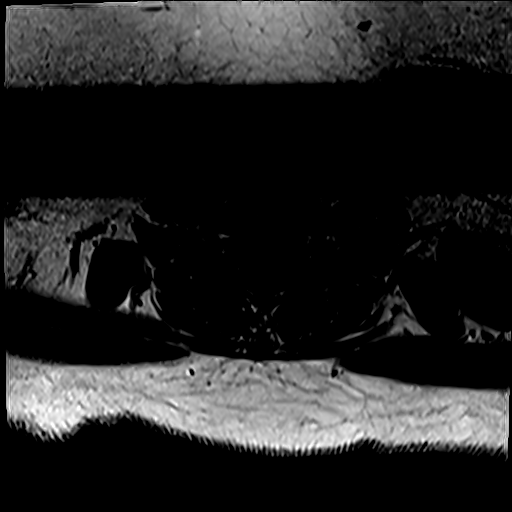
[im 11/31]
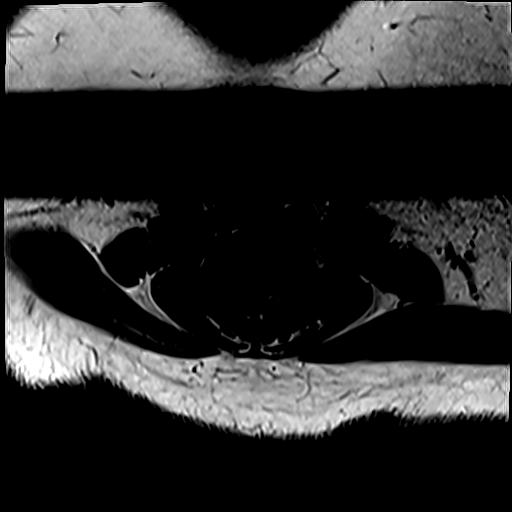
[im 16/31]
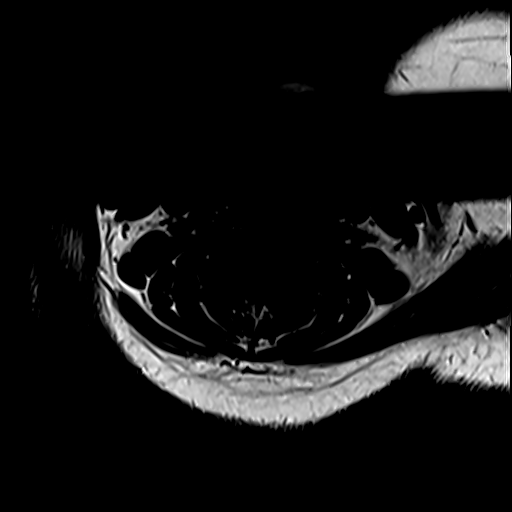
[im 21/31]
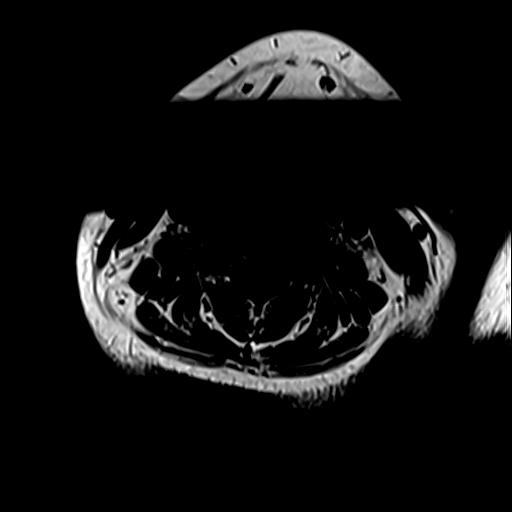
[im 26/31]
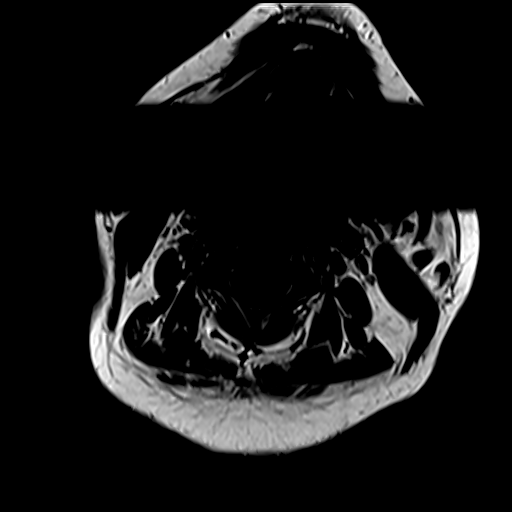
[im 31/31]
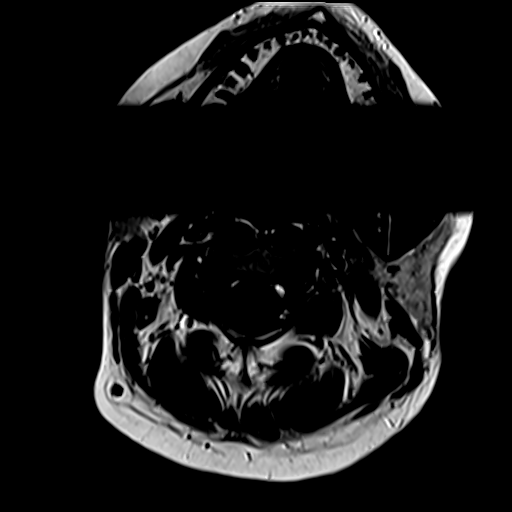

[Series 10: T2 post-contrast · sagittal · 3.0mm · 0.69mm/px · 5 of 19 slices shown]
[im 1/19]
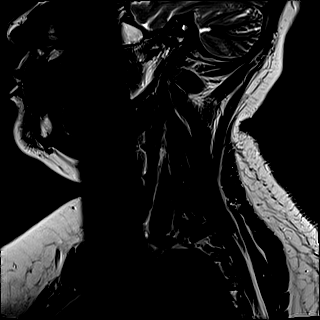
[im 5/19]
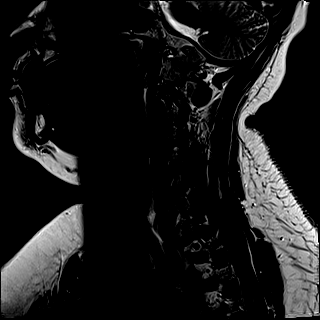
[im 10/19]
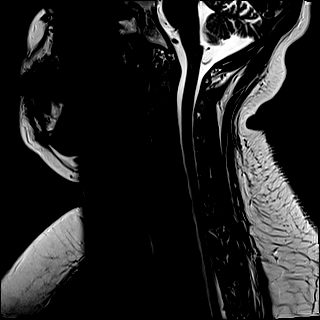
[im 14/19]
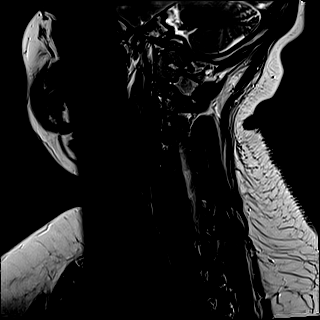
[im 19/19]
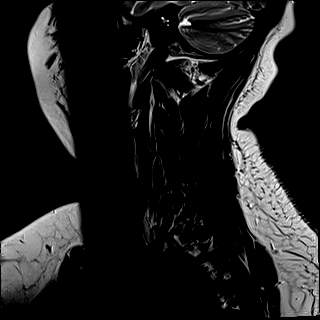

[Series 11: T1 fat-sat post-contrast · sagittal · 3.0mm · 0.69mm/px · 5 of 19 slices shown]
[im 1/19]
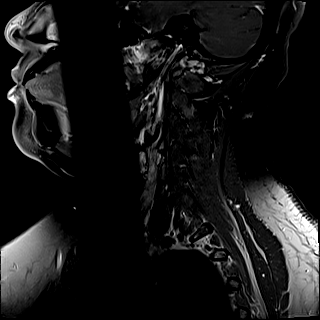
[im 5/19]
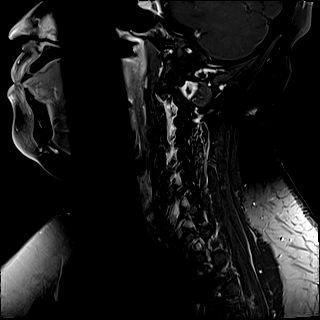
[im 10/19]
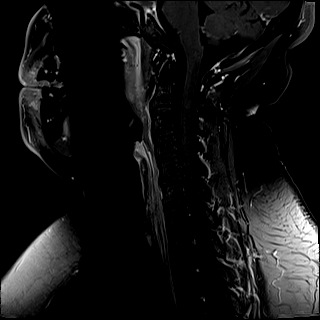
[im 14/19]
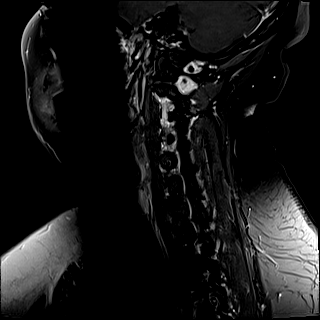
[im 19/19]
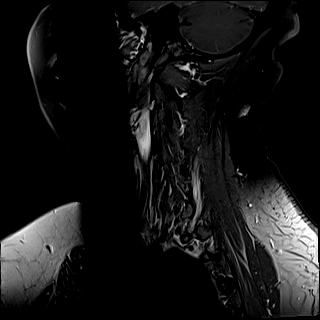

[Series 12: T1 post-contrast · axial · 3.0mm · 0.39mm/px · z∈[-21,-4]mm · 2 of 31 slices shown]
[im 1/31]
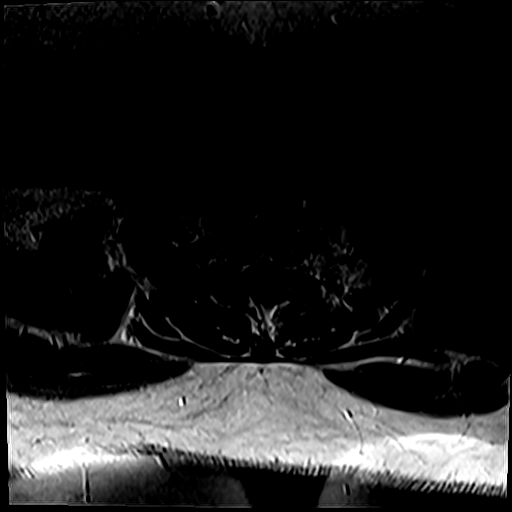
[im 6/31]
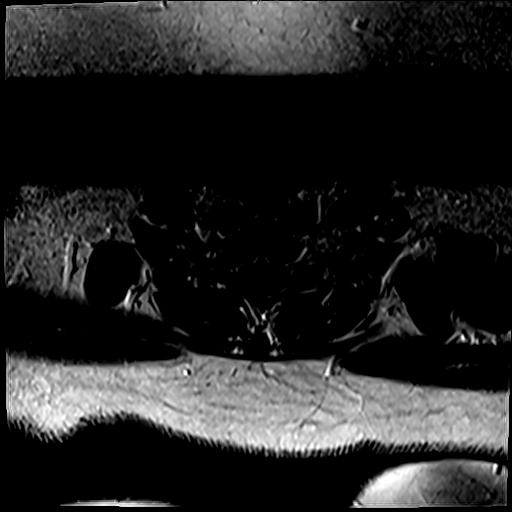

[36 of 48 positions shown; findings below may reference images not displayed]

FINDINGS: Alignment: Cervical spine straightening.  No listhesis.

Vertebrae: No fracture, suspicious marrow lesion, or significant
marrow edema.

Cord: 2-3 mm focus of T2 hyperintensity in the dorsal midline of the
spinal cord at C2-3 without enhancement. No definite second cord
lesion.

Posterior Fossa, vertebral arteries, paraspinal tissues:
Unremarkable.

Disc levels:

C2-3: Negative.

C3-4: Mild uncovertebral spurring results in mild left neural
foraminal stenosis. No spinal stenosis.

C4-5: Mild uncovertebral spurring result in borderline left neural
foraminal stenosis. No spinal stenosis.

C5-6: A small left foraminal disc osteophyte complex results in mild
left neural foraminal stenosis. No spinal stenosis.

C6-7: Tiny central disc protrusion without stenosis.

C7-T1: Negative.
IMPRESSION: 1. Small focus of nonenhancing T2 signal abnormality in the spinal
cord at C2-3. This is nonspecific and could be secondary to
demyelinating disease or an old insult from prior
infection/inflammation, or trauma.
2. Mild cervical spondylosis without compressive stenosis.

## 2023-01-18 ENCOUNTER — Other Ambulatory Visit: Payer: Self-pay | Admitting: Neurology

## 2023-01-19 NOTE — Telephone Encounter (Addendum)
Last seen on 08/18/21 No follow up scheduled   Pt is no longer under Dr.Sater care pt has moved and care has been transferred to [facility]. Phone: [phone],
